# Patient Record
Sex: Female | Born: 1960 | Race: White | Hispanic: No | Smoking: Current some day smoker
Health system: Southern US, Academic
[De-identification: ages and names within clinical notes are randomized; demographics above are authoritative.]

## PROBLEM LIST (undated history)

## (undated) DIAGNOSIS — G35 Multiple sclerosis: Secondary | ICD-10-CM

## (undated) DIAGNOSIS — R569 Unspecified convulsions: Secondary | ICD-10-CM

## (undated) DIAGNOSIS — G43909 Migraine, unspecified, not intractable, without status migrainosus: Secondary | ICD-10-CM

## (undated) DIAGNOSIS — F419 Anxiety disorder, unspecified: Secondary | ICD-10-CM

## (undated) DIAGNOSIS — F32A Depression, unspecified: Secondary | ICD-10-CM

## (undated) HISTORY — DX: Multiple sclerosis: G35

## (undated) HISTORY — DX: Migraine, unspecified, not intractable, without status migrainosus: G43.909

## (undated) HISTORY — PX: HX APPENDECTOMY: SHX54

## (undated) HISTORY — DX: Depression, unspecified: F32.A

## (undated) HISTORY — DX: Anxiety disorder, unspecified: F41.9

## (undated) HISTORY — PX: HX TONSILLECTOMY: SHX27

## (undated) HISTORY — DX: Unspecified convulsions: R56.9

---

## 2004-12-22 ENCOUNTER — Emergency Department (HOSPITAL_COMMUNITY): Payer: Self-pay | Admitting: EXTERNAL

## 2020-08-22 ENCOUNTER — Encounter (FREE_STANDING_LABORATORY_FACILITY): Admit: 2020-08-22 | Discharge: 2020-08-22 | Disposition: A | Discharge status: Home / Self Care | Payer: Self-pay

## 2021-03-10 ENCOUNTER — Ambulatory Visit (INDEPENDENT_AMBULATORY_CARE_PROVIDER_SITE_OTHER): Payer: Self-pay | Admitting: Neurology

## 2021-03-27 ENCOUNTER — Encounter (INDEPENDENT_AMBULATORY_CARE_PROVIDER_SITE_OTHER): Payer: Self-pay | Admitting: Neurology

## 2021-03-27 ENCOUNTER — Ambulatory Visit (INDEPENDENT_AMBULATORY_CARE_PROVIDER_SITE_OTHER): Payer: Medicaid Other | Admitting: Rheumatology

## 2021-03-27 ENCOUNTER — Ambulatory Visit: Payer: Medicaid Other | Attending: Neurology | Admitting: Neurology

## 2021-03-27 ENCOUNTER — Other Ambulatory Visit: Payer: Self-pay

## 2021-03-27 VITALS — BP 136/74 | HR 94 | Ht 63.0 in | Wt 107.6 lb

## 2021-03-27 DIAGNOSIS — G43709 Chronic migraine without aura, not intractable, without status migrainosus: Secondary | ICD-10-CM | POA: Insufficient documentation

## 2021-03-27 DIAGNOSIS — G35 Multiple sclerosis: Secondary | ICD-10-CM

## 2021-03-27 DIAGNOSIS — Z681 Body mass index (BMI) 19 or less, adult: Secondary | ICD-10-CM

## 2021-03-27 DIAGNOSIS — Z79899 Other long term (current) drug therapy: Secondary | ICD-10-CM | POA: Insufficient documentation

## 2021-03-27 LAB — COMPREHENSIVE METABOLIC PANEL, NON-FASTING
ALBUMIN: 3.8 g/dL (ref 3.4–4.8)
ALKALINE PHOSPHATASE: 71 U/L (ref 50–130)
ALT (SGPT): 21 U/L (ref 8–22)
ANION GAP: 10 mmol/L (ref 4–13)
AST (SGOT): 29 U/L (ref 8–45)
BILIRUBIN TOTAL: 0.7 mg/dL (ref 0.3–1.3)
BUN/CREA RATIO: 8 (ref 6–22)
BUN: 8 mg/dL (ref 8–25)
CALCIUM: 9.2 mg/dL (ref 8.8–10.2)
CHLORIDE: 104 mmol/L (ref 96–111)
CO2 TOTAL: 27 mmol/L (ref 23–31)
CREATININE: 0.95 mg/dL (ref 0.60–1.05)
ESTIMATED GFR: 65 mL/min/BSA (ref >=60)
GLUCOSE: 89 mg/dL (ref 65–125)
POTASSIUM: 4.6 mmol/L (ref 3.5–5.1)
PROTEIN TOTAL: 7 g/dL (ref 6.0–8.0)
SODIUM: 141 mmol/L (ref 136–145)

## 2021-03-27 LAB — CBC WITH DIFF
BASOPHIL #: 0.1 10*3/uL (ref <=0.20)
BASOPHIL %: 1 %
EOSINOPHIL #: 0.15 10*3/uL (ref <=0.50)
EOSINOPHIL %: 2 %
HCT: 41.7 % (ref 34.8–46.0)
HGB: 13.6 g/dL (ref 11.5–16.0)
IMMATURE GRANULOCYTE #: <0.1 10*3/uL (ref <0.10)
IMMATURE GRANULOCYTE %: 0 % (ref 0–1)
LYMPHOCYTE #: 2.32 10*3/uL (ref 1.00–4.80)
LYMPHOCYTE %: 34 %
MCH: 30 pg (ref 26.0–32.0)
MCHC: 32.6 g/dL (ref 31.0–35.5)
MCV: 91.9 fL (ref 78.0–100.0)
MONOCYTE #: 0.49 10*3/uL (ref 0.20–1.10)
MONOCYTE %: 7 %
MPV: 9.9 fL (ref 8.7–12.5)
NEUTROPHIL #: 3.85 10*3/uL (ref 1.50–7.70)
NEUTROPHIL %: 56 %
PLATELETS: 326 10*3/uL (ref 150–400)
RBC: 4.54 10*6/uL (ref 3.85–5.22)
RDW-CV: 13 % (ref 11.5–15.5)
WBC: 6.9 10*3/uL (ref 3.7–11.0)

## 2021-03-27 LAB — HIV1/HIV2 SCREEN, COMBINED ANTIGEN AND ANTIBODY: HIV SCREEN, COMBINED ANTIGEN & ANTIBODY: NEGATIVE

## 2021-03-27 LAB — HEPATITIS C ANTIBODY SCREEN WITH REFLEX TO HCV PCR: HCV ANTIBODY QUALITATIVE: NEGATIVE

## 2021-03-27 LAB — HEPATITIS B SURFACE ANTIGEN: HBV SURFACE ANTIGEN QUALITATIVE: NEGATIVE

## 2021-03-27 LAB — HEPATITIS A (HAV) IGM ANTIBODY: HAV IGM: NEGATIVE

## 2021-03-27 LAB — HEPATITIS B CORE IGM, AB: HBV CORE IGM ANTIBODY QUALITATIVE: NEGATIVE

## 2021-03-27 MED ORDER — LAMOTRIGINE 25 MG TABLET
25.0000 mg | ORAL_TABLET | Freq: Every day | ORAL | 5 refills | Status: DC
Start: 2021-03-27 — End: 2021-09-25

## 2021-03-27 MED ORDER — TIZANIDINE 2 MG TABLET
2.0000 mg | ORAL_TABLET | Freq: Three times a day (TID) | ORAL | 5 refills | Status: DC
Start: 2021-03-27 — End: 2021-08-21

## 2021-03-27 NOTE — Progress Notes (Signed)
[facility] of [facility]  Multiple Sclerosis and Clinical Neuroimmunology Clinic    Date:  03/27/2021  Name: Kaitlin Adams  Age:  60 y.o.  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  Steroid history:  None    Neurologic history:  -2015: Numbness in distal lower extremities, progressed to ankles and now to knees. Got MRI and LP. Started on Gilenya.   -Diagnosed with seizures in summer 2020 - arms contract, knees pull to her chest, shakes, jaw clenches, confused afterwards.    Pertinent studies:  -Reviewed MRI report from [place] from 01/2021 was read as stable white matter disease compatible with multiple sclerosis compared to 08/2019. Images not available.  -Prior records from [facility] indicate that she had C/T spine lesions on prior MRI and had 10 OCBs in her CSF.    ------------------------    Kaitlin Adams is a 60 y.o. female who presents with a chief concern of:     Chief Complaint   Patient presents with   . Multiple Sclerosis      History is detailed above. In addition we discussed the following today:    -+ headaches: constant headaches, has sinus issues, + nausea, + photophobia, phonophobia.    MS Review of Systems:  Fatigue/sleep: + Fatigue. Sleeps OK at night with Ambien, but does toss and turn sometimes. Legs jerk intermittently in her sleep.  Cognition: Some forgetfulness.   Vision: Wears glasses. Sees Dr Lucrezia Europe, has had reduced peripheral vision. Sees spots in her vision at times.  Spasticity: + stiffness. On baclofen 10 mg TID. Not significantly helpful, takes two sometimes without increased benefit. Has taken flexeril previously. Gets cramps in fingers and toes.  Gait: Legs give out  intermittently. Has fallen on the stairs at her house. Takes a yoga class.  Bladder/bowel: +slow urination, + frequency, constipation. Takes Linzess.  Pain: + chronic pain.  Mood: "Not the best." On Abilify.     Past Medical History:   Diagnosis Date   . Depression    . Migraine    . Multiple sclerosis (CMS HCC)    . Seizure (CMS HCC)    "Spot" on her lung.    Past Surgical History:   Procedure Laterality Date   . HX APPENDECTOMY     . HX CESAREAN SECTION     . HX TONSILLECTOMY         Current Outpatient Medications   Medication Sig   . albuterol sulfate (PROVENTIL OR VENTOLIN OR PROAIR) 90 mcg/actuation Inhalation HFA Aerosol Inhaler Take 1-2 Puffs by inhalation Every 6 hours as needed   . ARIPiprazole (ABILIFY) 2 mg Oral Tablet Take 2 mg by mouth Once a day   . azelastine (ASTELIN) 137 mcg (0.1 %) Nasal Aerosol, Spray USE 1 TO 2 SPRAY(S) IN EACH NOSTRIL ONCE DAILY   . baclofen (LIORESAL) 10 mg Oral Tablet Take 10 mg by mouth Three times a day   . cetirizine (ZYRTEC) 10 mg Oral Tablet Take 1 Tablet by mouth Once a day   . cholecalciferol, Vitamin D3, 75 mcg (3,000 unit) Oral Tablet Take 3,000 Units by mouth Once a day   . divalproex (DEPAKOTE) 250  mg Oral Tablet, Delayed Release (E.C.) TAKE 1 TABLET BY MOUTH EVERY 12 HOURS DO NOT CRUSH OR CHEW (Patient not taking: Reported on 03/27/2021)   . ELDERBERRY FRUIT ORAL Take by mouth   . Fingolimod 0.5 mg Oral Capsule Take 0.5 mg by mouth (Patient not taking: Reported on 03/27/2021)   . hydrOXYzine pamoate (VISTARIL) 50 mg Oral Capsule TAKE 1 CAPSULE BY MOUTH THREE TIMES DAILY AS NEEDED   . linaCLOtide (LINZESS) 145 mcg Oral Capsule Take 1 Capsule by mouth   . LORazepam (ATIVAN) 1 mg Oral Tablet Take 1 mg by mouth   . mirtazapine (REMERON) 30 mg Oral Tablet Take 30 mg by mouth Every night   . PARoxetine (PAXIL) 20 mg Oral Tablet Take 20 mg by mouth Once a day   . zinc 50 mg Oral Tablet Take 30 mg by mouth Once a day   . zolpidem (AMBIEN) 10 mg Oral Tablet Take 10 mg by  mouth Every night as needed   Depakote was 250 mg BID.  Dr Alfonse Ras is her psychiatrist at [facility].    Allergies   Allergen Reactions   . Ceclor [Cefaclor]    . Penicillins    . Sulfa (Sulfonamides)      Family Medical History:    None     Multiple sclerosis: Four cousins, possibly her son.    Social History     Socioeconomic History   . Marital status: Married   Tobacco Use   . Smoking status: Never Smoker   . Smokeless tobacco: Never Used   Substance and Sexual Activity   . Alcohol use: Yes   . Drug use: Never   No smoking, minimal EtOH use.    Review of Systems:  Gen: + fatigue. ID: No current infections. HEENT: No hearing or vision changes. Chronic rhinorrhea. Respiratory: + shortness of breath. Cardiac: + chest pain. GU: No hematuria. Rare UTIs. GI: No GI bleeding. Extremities: No peripheral edema. Musculoskeletal: + pain. Neuro: As above. Psychiatric: As above. Skin: Has had "skin coming off of her eye" recently.    Examination:  Vitals: BP 136/74   Pulse 94   Ht 1.6 m (5\' 3" )   Wt 48.8 kg (107 lb 9.4 oz)   BMI 19.06 kg/m     General Exam:  General: No acute distress.  HEENT: No scleral icterus.  Extremities: No peripheral edema.  Psychiatric: Affect calm.    Neurologic Exam:  Ophthalmoscopic: Limited undilated exam but no obvious papilledema or pallor.  Orientation: Awake, alert, appropriately oriented.  Memory: Recent and remote memory appear intact. Formal memory testing not completed today.  Attention: Normal in conversation.  Knowledge: Appropriate.  Language: Fluent with intact comprehension.  Speech: Normal.  Cranial nerves: Pupils are equal and reactive bilaterally. Extraocular movements are intact. Face activates symmetrically. Hearing intact to conversation. Shoulder shrug 5/5 bilaterally. Unable to assess remainder of cranial nerves as patient wearing mask for infection prevention purposes.  Sensory: Intact to light touch in bilateral hands.  Muscle tone: Spastic catch in bilateral  LE.    Muscle exam:  Arm Right Left Leg Right Left   Deltoid 5/5 5/5 Iliopsoas 4/5 4/5   Biceps 5/5 5/5 Quads 5/5 5/5   Triceps 5/5 5/5 Hamstrings 5/5 5/5   Interossei 5/5 5/5 Ankle Dorsi Flexion 4/5 4/5     Reflexes:   RJ BJ TJ KJ AJ Plantars   Right 2+ 2+ 2+ 3+ 2+ Downgoing   Left 2+ 2+ 2+ 3+ 2+ Downgoing  Coordination: Finger-nose without dysmetria bilaterally.   Gait: Normal casual gait.    Assessment and Plan:  1. Multiple sclerosis (CMS HCC)    2. Chronic migraine w/o aura w/o status migrainosus, not intractable    3. 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 Do not have prior imaging to review but reports from [facility] appear consistent. Will request prior images and recommend updating MRI of her C/T spine. Discussed DMT options in detail focusing on resuming Gilenya, vs transition to Mayzent or Aubagio. She opted to transition to Mayzent which I think is a good choice. Discussed risks:benefits of Mayzent in detail including risk of PML. Start form filled out today and will get baseline labs. For symptomatic management, will transition baclofen to tizanidine which may also help with headaches. Discussed side effects including sedation. For history of seizure, discussed resuming Depakote vs trial of Lamictal - will try Lamictal. Discussed side effects in detail including risk of SJS. Initially planned on Zonisamide but she has sulfa allergy. Also considered gabapentin/Lyrica as may have concomitant benefit in pain/headaches but she has been on these previously and did not tolerate.     Orders Placed This Encounter   . MRI SPINE CERVICAL W/WO CONTRAST   . MRI SPINE THORACIC W/WO CONTRAST   . CBC/DIFF   . COMPREHENSIVE METABOLIC PANEL, NON-FASTING   . HEPATITIS A IGM ANTIBODY   . HEPATITIS B SURFACE ANTIGEN   . HEPATITIS B CORE IGM, AB   . HEPATITIS C ANTIBODY SCREEN WITH REFLEX TO HCV PCR   . VARICELLA-ZOSTER ANTIBODY, IGG, SERUM   .  QUANTIFERON TB GOLD PLUS, BLOOD   . HIV1/HIV2 SCREEN, COMBINED ANTIGEN AND ANTIBODY   . CANCELED: HELP LAB ORDER   . ECG 12-LEAD   . tiZANidine (ZANAFLEX) 2 mg Oral Tablet   . lamoTRIgine (LAMICTAL) 25 mg Oral Tablet       Return in about 3 months (around 06/26/2021).     On the day of the encounter, a total of 65 minutes was spent on this patient encounter including review of historical information, examination, documentation and post-visit activities.    Rosalyn Gess, MD 03/27/2021  Assistant Professor, Department of Neurology

## 2021-03-27 NOTE — Patient Instructions (Addendum)
Lamictal for seizures:  -Start 1 tablet daily x2 weeks.  -Then increase to 1 tablet twice daily x2 weeks.  -Then increase to 1 tablet in the morning and 1 tablet at night in 2 weeks.  -Then increase to 2 tablets twice daily.    1) We strongly encourage you to sign up for our patient portal, Mychart. Most results and messages will be communicated with you through Mychart when you are in between appointments.  2) Test results will be automatically released to your Mychart as soon as they are done. This means you may see your results before Dr Elesa Massed does. Dr Elesa Massed tries to send a message on Mychart with her comments about your results within one week. You will be contacted sooner if you have emergency findings on your tests. If you do not use Mychart, your results will be discussed with you at your next follow up visit. If you have additional questions about your results that cannot be addressed in Mychart, and cannot wait until your next appointment, please schedule an earlier follow up appointment or schedule a telephone visit to discuss.   3) Dr. Elesa Massed does not routinely respond to messages (Mychart or telephone) on Wednesdays unless it is an emergency.   4) Please allow up to 48 business hours for a reply to [employer]. If you are having a medical emergency you should NOT use Mychart to communicate and should be evaluated in your nearest ER. Mychart messages are not viewed on weekends or after hours.

## 2021-03-27 NOTE — Progress Notes (Signed)
Spoke with Mr. And Mrs. Winslett during appointment to discuss Mayzent.      Previous DMT: Gilenya     Discussed differences and similarities between Gilenya and Mayzent.  Mayzent has less impact on HR because of its increased specificity on the body as well as the gradual dose increase.  The lessened impact on heart rate allows the FDO to be skipped. Side effects (infection risk, blood pressure, etc) and monitoring requirements are mostly the same otherwise.  Mayzent dose will be determined by genetic typing lab results.     Advised the insurance process can take several weeks, will keep updated on insurance response/ if referring to NPAF.  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     Reviewed and updated medications with Mrs. Detlefsen.    - Of note, when took daily Vitamin Caused levels too increase to unsafe range.  Now only takes 3,000 units MWF. Updated for future monitoring reference.   * Tizanidine added during appt today - will contact Mrs. 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     Also reviewed allergies - sulfa allergy was to an antibiotic prescribed for ears when younger. (Bactrim).  Unsure what the reaction was.      Provided with contact information for any questions or concerns that arise.  Maylon Cos, [place]  03/27/2021, 15:55

## 2021-03-28 LAB — VARICELLA-ZOSTER ANTIBODY, IGG, SERUM: VZV IGG QUALITATIVE: POSITIVE

## 2021-03-31 LAB — QUANTIFERON TB GOLD PLUS, BLOOD
MITOGEN MINUS NIL RESULT: >10 IU/mL
NIL RESULT: 0.01 IU/mL
QUANTIFERON, QUALITATIVE: NEGATIVE
TB1 AG MINUS NIL RESULT: 0.01 IU/mL
TB2 AG MINUS NIL RESULT: 0 IU/mL

## 2021-04-03 ENCOUNTER — Encounter (INDEPENDENT_AMBULATORY_CARE_PROVIDER_SITE_OTHER): Payer: Self-pay

## 2021-04-09 LAB — CYTOCHROME P450 2C9,NOVARTIS

## 2021-04-10 ENCOUNTER — Encounter (INDEPENDENT_AMBULATORY_CARE_PROVIDER_SITE_OTHER): Payer: Self-pay

## 2021-04-10 ENCOUNTER — Telehealth (INDEPENDENT_AMBULATORY_CARE_PROVIDER_SITE_OTHER): Payer: Self-pay

## 2021-04-10 MED ORDER — MAYZENT 2 MG TABLET
2.0000 mg | ORAL_TABLET | Freq: Every day | ORAL | 11 refills | Status: DC
Start: 2021-04-10 — End: 2022-03-31
  Filled 2021-04-11 – 2021-10-03 (×3): qty 30, 30d supply, fill #0
  Filled 2021-11-03: qty 30, 30d supply, fill #1
  Filled 2021-12-02: qty 30, 30d supply, fill #2
  Filled 2022-01-01: qty 30, 30d supply, fill #3
  Filled 2022-01-29: qty 30, 30d supply, fill #4
  Filled 2022-03-02 – 2022-03-18 (×2): qty 30, 30d supply, fill #5

## 2021-04-10 MED ORDER — MAYZENT STARTER PACK (FOR 2 MG MAINT DOSE) 0.25 MG (12 TABS) TABLETS
ORAL_TABLET | ORAL | 0 refills | Status: DC
Start: 2021-04-10 — End: 2022-01-06
  Filled 2021-04-11 – 2021-09-29 (×2): qty 12, 5d supply, fill #0

## 2021-04-10 NOTE — Telephone Encounter (Signed)
Prescription for Kaitlin sent to [facility] now that CYP testing results have come back. Start form uploaded to media.    While reviewing lab results noticed EKG was not complete.  Will need to complete before starting therapy.   ----  Spoke with [name] to update on EKG. Kaitlin Adams stated the new medicine (lamotrigine or tizanidine) has knocked her on her butt."  She started both the lamotrigine and tizanidine yesterday. Verified she did discontinue Depakote.  Insurance wouldn't let tizanidine be filled until baclofen was used up.  She is unsure which medication is causing to be so tired.  Discussed that tizanidine can cause drowsiness and it is okay to skip a dose if making too tired.  She stated she did yoga yesterday and thinks it may be contributing to how tired she was.   Inquired how to take the lamotrigine as the instructions say to take as instructed. Advised the instructions should have been on the printout from her appointment, but will send a MyChart message with instructions to be safe.     Discussed EKG needs to be complete before can start Kaitlin.  Advised CYP results came back finally today, starting process for authorization now. If approved before EKG complete, will need to hold until able to do so.  Kaitlin Adams stated she will complete the EKG on the same day as her MRI is scheduled (5/23).    Encouraged to contact office with any questions, concerns, or if she develops side effects to any of the new medications. She verbalized agreement and understanding to all information.   Maylon Cos, [place]  04/10/2021, 16:02    MyChart message with calender outlining titration sent to patient.  Maylon Cos, [place]  04/10/2021, 16:19

## 2021-04-11 ENCOUNTER — Other Ambulatory Visit: Payer: Self-pay

## 2021-04-11 ENCOUNTER — Other Ambulatory Visit (INDEPENDENT_AMBULATORY_CARE_PROVIDER_SITE_OTHER): Payer: Self-pay

## 2021-04-11 NOTE — Telephone Encounter (Signed)
Prior authorization for Mayzent submitted electronically on 04/11/2021 through CoverMyMeds. Key [id number]. Waiting for response from payor.    Sissy Hoff, Pharmacy Technician  04/11/2021, 14:29

## 2021-04-14 ENCOUNTER — Other Ambulatory Visit: Payer: Self-pay

## 2021-04-15 ENCOUNTER — Other Ambulatory Visit: Payer: Self-pay

## 2021-04-15 NOTE — Telephone Encounter (Signed)
Specialty Pharmacy Note    Prior Authorization for medication Mayzent has been approved by payor RDT until 04/09/22.  Approval notice has been scanned into Epic and can be found in the Media tab. Faxed Hub form.    [facility] can fill.    If you have any questions, don't hesitate to contact the Specialty Pharmacy Sissy Hoff, Pharmacy Technician  04/15/2021, 16:10

## 2021-04-16 ENCOUNTER — Other Ambulatory Visit: Payer: Self-pay

## 2021-04-17 ENCOUNTER — Encounter (INDEPENDENT_AMBULATORY_CARE_PROVIDER_SITE_OTHER): Payer: Self-pay

## 2021-04-17 ENCOUNTER — Other Ambulatory Visit: Payer: Self-pay

## 2021-04-18 ENCOUNTER — Other Ambulatory Visit: Payer: Self-pay

## 2021-04-21 ENCOUNTER — Ambulatory Visit (HOSPITAL_COMMUNITY): Payer: Medicaid Other

## 2021-04-22 ENCOUNTER — Other Ambulatory Visit: Payer: Self-pay

## 2021-04-24 ENCOUNTER — Other Ambulatory Visit: Payer: Self-pay

## 2021-04-25 ENCOUNTER — Other Ambulatory Visit: Payer: Self-pay

## 2021-04-29 ENCOUNTER — Other Ambulatory Visit: Payer: Self-pay

## 2021-04-30 ENCOUNTER — Other Ambulatory Visit: Payer: Self-pay

## 2021-05-01 ENCOUNTER — Other Ambulatory Visit: Payer: Self-pay

## 2021-05-02 ENCOUNTER — Other Ambulatory Visit: Payer: Self-pay

## 2021-05-02 ENCOUNTER — Ambulatory Visit (INDEPENDENT_AMBULATORY_CARE_PROVIDER_SITE_OTHER): Payer: Self-pay

## 2021-05-02 NOTE — Telephone Encounter (Signed)
-----   Message from Lamont Dowdy, RN sent at 05/02/2021  3:08 PM EDT -----  Regarding: FW: Hayleigh/Ward  ----- Message from Georgina Peer sent at 05/02/2021  3:07 PM EDT -----  Vilinda Boehringer, incomplete enrollment form, missing one page.  Complete 2 page enrollment form with all filled out.  Thanks

## 2021-05-02 NOTE — Telephone Encounter (Signed)
The start form has been sent twice. It is not needed since Kaitlin Adams has [facility] medicaid, making her ineligible for copay assistance. NPAF is not needed since insurance approved [place] . Attempted to return call to Kaitlin Adams.  Left VM advising the start form will not be resent, as they will forward the Rx on our behalf (despite being asked not to do numerous times).  Kaitlin Adams is not cleared to start therapy yet, therefore, they don't need the 2nd page (AKA the Rx). Will also send e-mail to Kaitlin Adams requesting they stop calling us to ask for this.   Maylon Cos, PHARMD  05/02/2021, 17:17

## 2021-05-05 ENCOUNTER — Other Ambulatory Visit: Payer: Self-pay

## 2021-05-06 ENCOUNTER — Other Ambulatory Visit: Payer: Self-pay

## 2021-05-12 ENCOUNTER — Other Ambulatory Visit: Payer: Self-pay

## 2021-05-13 ENCOUNTER — Other Ambulatory Visit: Payer: Self-pay

## 2021-05-13 IMAGING — MG 3D SCREENING MAMMO BIL W/CAD & TOMO
5 series · 7 of 24 positions shown · non-contrast
Comparison: Outside mammograms dated 03/08/2019, 06/13/2020.

------------- REPORT [id number] -------------
﻿

SEPTY, SIU HO
EXAM:  BILATERAL DIGITAL SCREENING MAMMOGRAM WITH 3D TOMOSYNTHESIS AND CAD
INDICATION: Asymptomatic 60-year-old with no family history of breast cancer in first-degree relatives.  Lifetime breast cancer risk 7.9%.

[L]
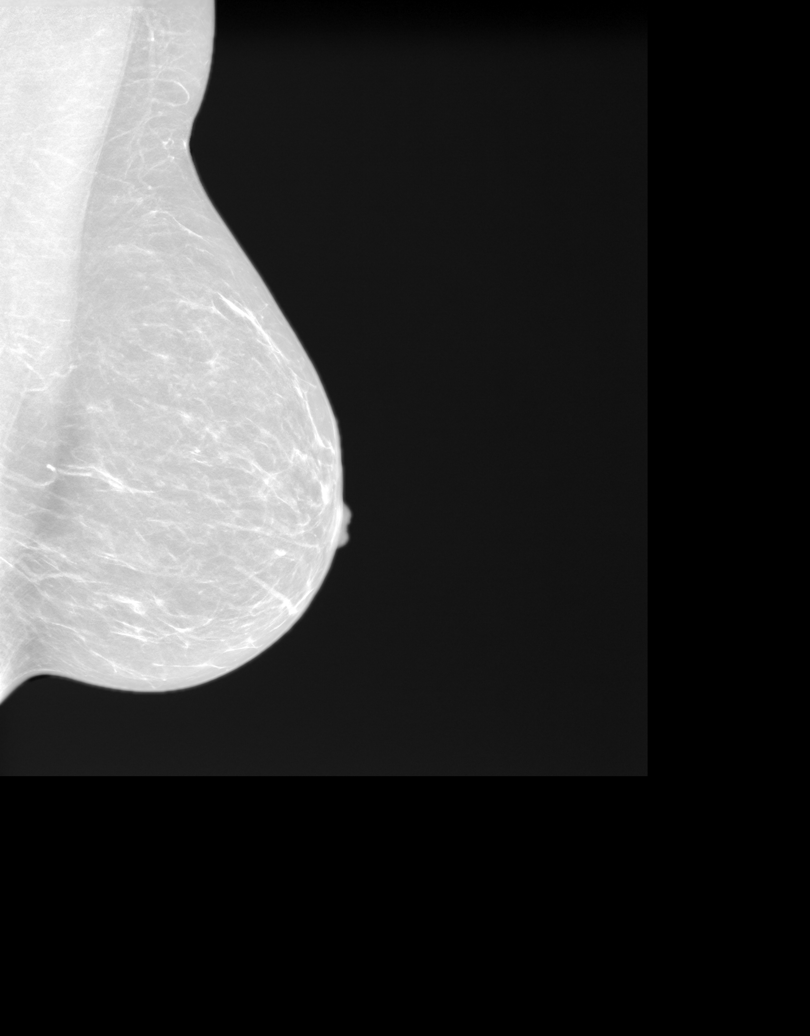

[R]
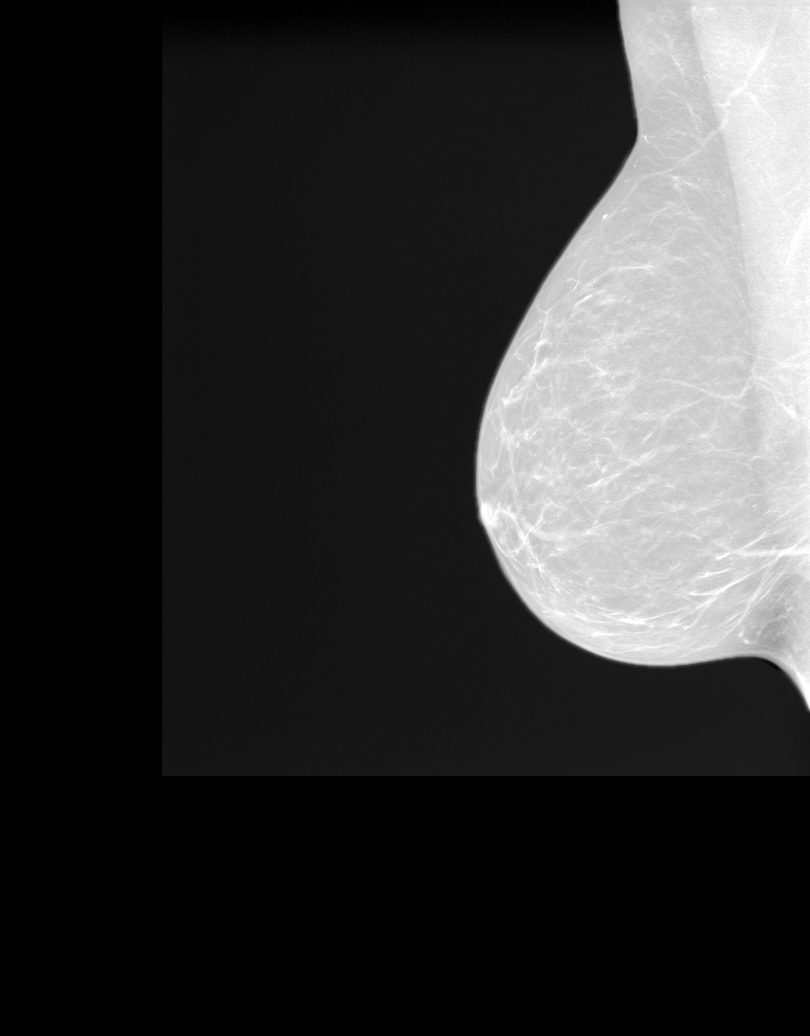

[R CC tomo · right · 0.10mm/px · 2 of 2 slices shown]
[im 1/2]
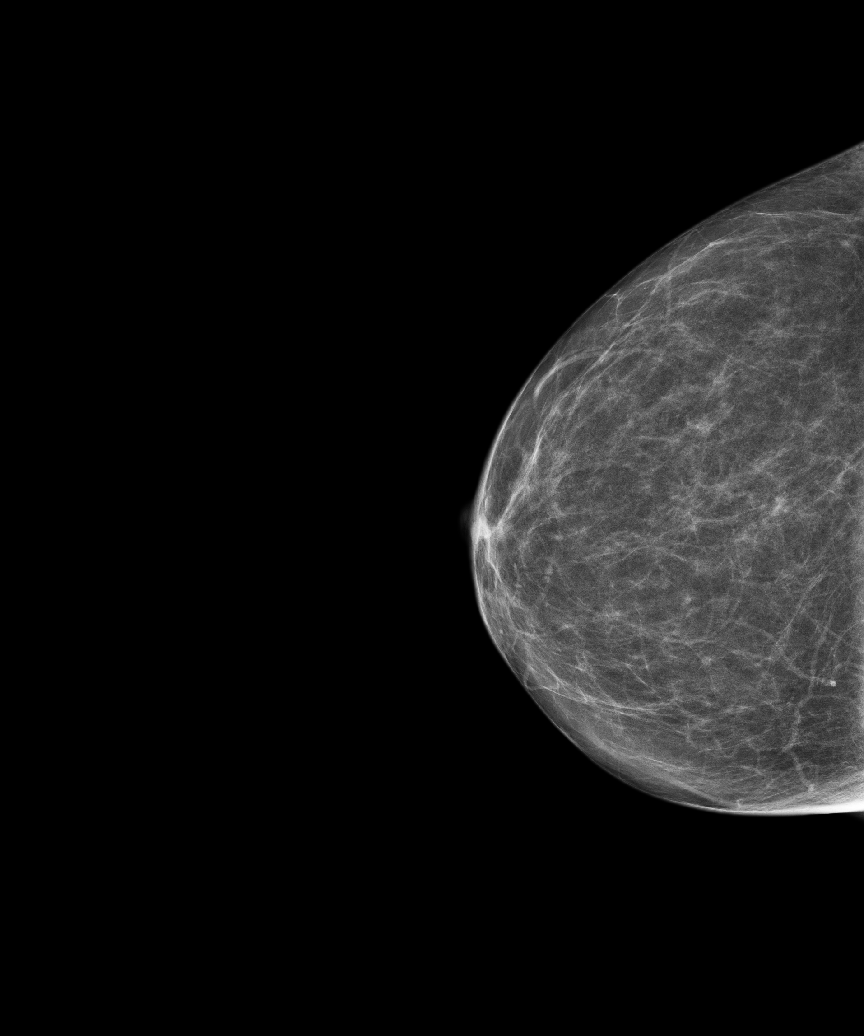
[im 2/2]
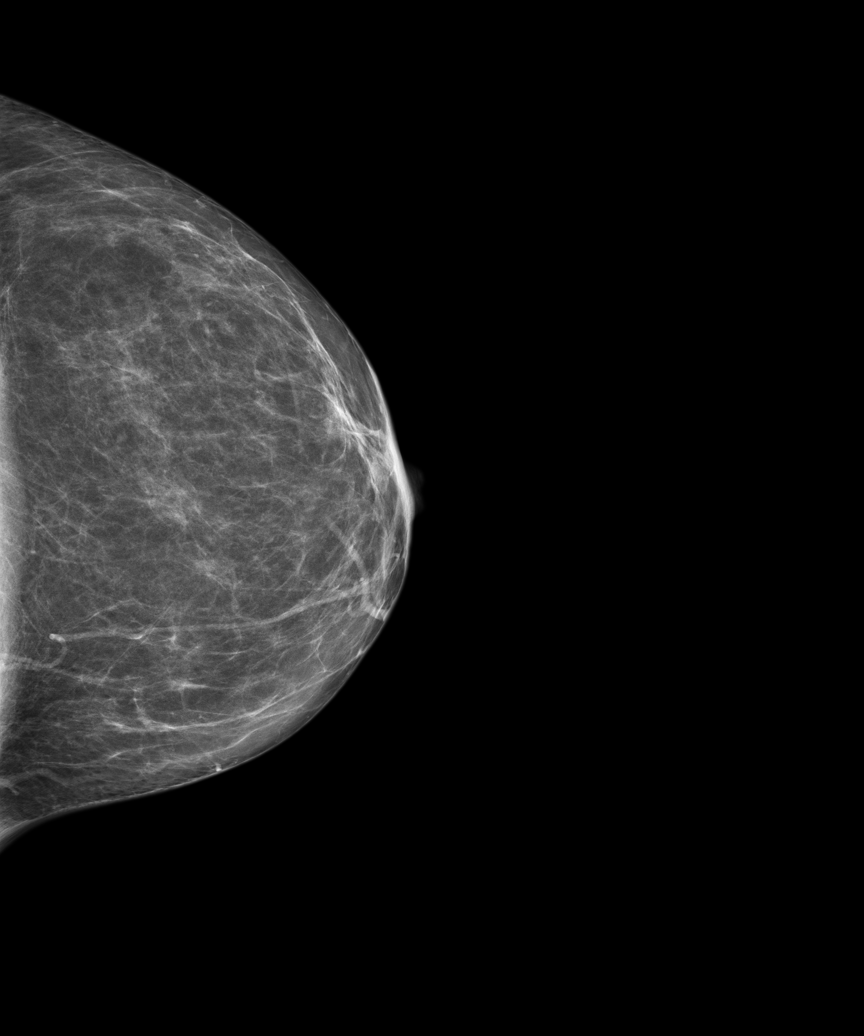

[3D SCREENING MAMMO BIL W/CAD & TOMO tomo · 2 acquisitions, 2 frames shown (1 of 2)]
[im 1/2]
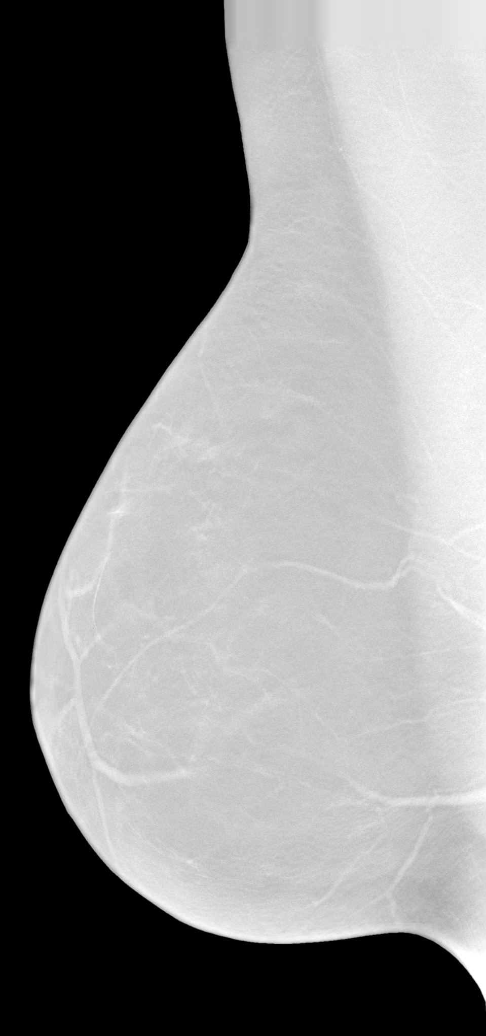
[im 2/2]
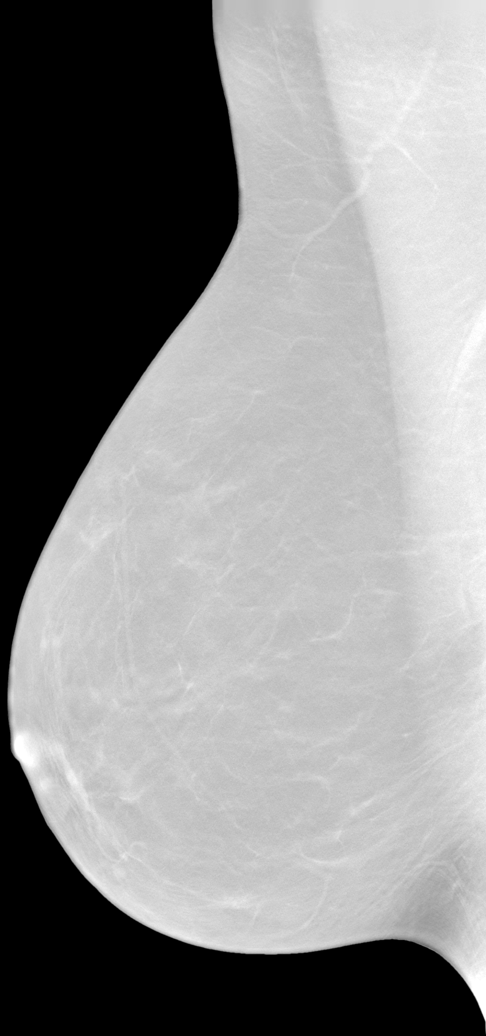

[3D SCREENING MAMMO BIL W/CAD & TOMO tomo (2 of 2) · tomo slice 7/41.0]
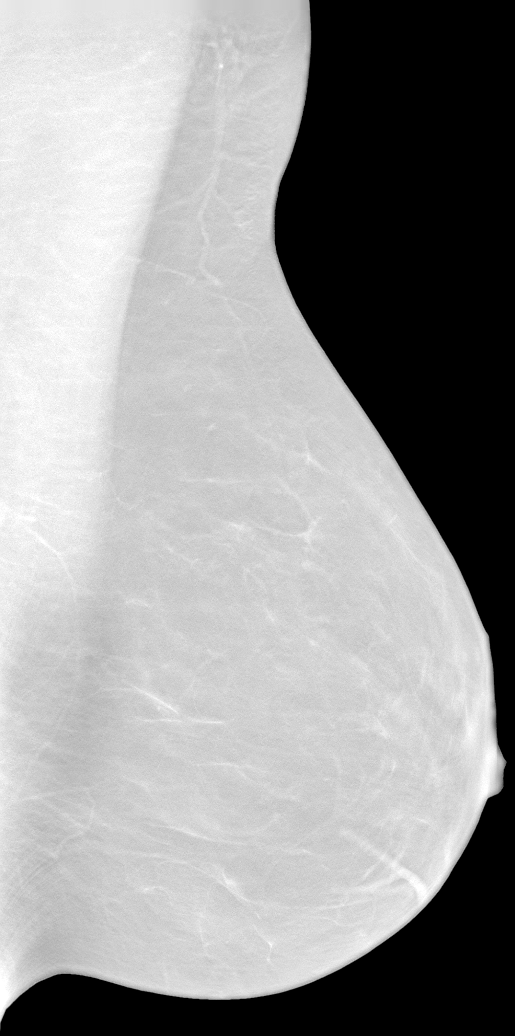

[7 of 24 positions shown; findings below may reference images not displayed]

FINDINGS: No focal mass or architectural changes are noted.  No abnormal calcific densities, skin changes, nipple changes or duct dilation are seen.
IMPRESSION: 1.  Stable mammographic findings.  Clinical and mammographic followup at 12 months.

2.  BIRADS 2-Benign findings. Patient has been added in a reminder system with a target date for the next screening mammography.

3.  DENSITY CODE – B (Scattered areas of fibroglandular density). 

Final Assessment Code:

Bi-Rads 2 

BI-RADS 0
 Need additional imaging evaluation.

BI-RADS 1
 Negative mammogram.

BI-RADS 2
 Benign finding.

BI-RADS 3
 Probably benign finding; short-interval follow-up suggested.

BI-RADS 4
 Suspicious abnormality; biopsy should be considered.

BI-RADS 5
 Highly suggestive of malignancy; appropriate action should be taken.

BI-RADS 6
 Known biopsy-proven malignancy; appropriate action should be taken.

NOTE:
In compliance with Federal regulations, the results of this mammogram are being sent to the patient.

------------- REPORT [id number] -------------
[facility]
[address]
T[place] Ms.DIRK, KAI-U[place]:
We wish to report the following on your recent mammography examination. We are sending a report to your referring physician or other health care provider. 
(       Normal/Negative:
No evidence of cancer.
This statement is mandated by the [place], Department of Health.
Your examination was performed by one of our technologists, who are registered radiological technologists and also specially certified in mammography:
___
Markland, Marjuan (M)

Your mammogram was interpreted by our radiologist.

( 
Collette Sedman, M.D.

(Annual Breast Examination by a physician or other health care provider
(Annual Mammography Screening beginning at age 40
(Monthly Breast Self Examination

## 2021-05-15 ENCOUNTER — Other Ambulatory Visit: Payer: Self-pay

## 2021-05-19 ENCOUNTER — Ambulatory Visit (INDEPENDENT_AMBULATORY_CARE_PROVIDER_SITE_OTHER): Payer: Self-pay | Admitting: Neurology

## 2021-05-19 NOTE — Nursing Note (Signed)
Attempted to call- no pick-up or VM. Need ECG completed before starting Mayzent. Order mailed to home address to do locally. Call if any questions    Clovis Pu, RN  05/19/2021, 14:47

## 2021-05-20 ENCOUNTER — Other Ambulatory Visit: Payer: Self-pay

## 2021-05-26 ENCOUNTER — Other Ambulatory Visit: Payer: Self-pay

## 2021-05-28 ENCOUNTER — Other Ambulatory Visit: Payer: Self-pay

## 2021-05-29 ENCOUNTER — Encounter (INDEPENDENT_AMBULATORY_CARE_PROVIDER_SITE_OTHER): Payer: Self-pay | Admitting: Neurology

## 2021-05-29 ENCOUNTER — Other Ambulatory Visit: Payer: Self-pay

## 2021-05-29 ENCOUNTER — Ambulatory Visit (INDEPENDENT_AMBULATORY_CARE_PROVIDER_SITE_OTHER): Payer: Self-pay | Admitting: Neurology

## 2021-05-29 NOTE — Telephone Encounter (Addendum)
Called phone number listed in message, no answer, unable to leave voicemail, sent a MyChart message.    ----- Message from Clovis Pu, RN sent at 05/29/2021  8:26 AM EDT -----  Regarding: FW: Dalana Pfahler  ----- Message from Jerrel Ivory sent at 05/28/2021  2:48 PM EDT -----  Yafet Cline - husband is calling and states that the medications you put pt on lamoTRIgine (LAMICTAL) 25 mg Oral Tablet and the tiZANidine (ZANAFLEX) 2 mg Oral Tablet   is making her very confused and dizzy and nauseous.  She has discontinued them and he needs to speak to you about getting something different.    Please call him.    Thanks,  Lupita Leash

## 2021-06-04 ENCOUNTER — Other Ambulatory Visit: Payer: Self-pay

## 2021-08-06 ENCOUNTER — Telehealth (INDEPENDENT_AMBULATORY_CARE_PROVIDER_SITE_OTHER): Payer: Self-pay

## 2021-08-06 NOTE — Telephone Encounter (Signed)
Kaitlin Adams contacted office with concerns regarding seizure activity. She reports discontinuing lamotrigine and tizanidine on own due to significant GI upset and drowsiness. Stopped taking both medications in late June, seizure episodes started shortly after.  Last episode Thursday (07/31/21) with loss of consciousness. She reports being aware of seizure while occurring. Denies any infections or sickness outside of seasonal allergies. Verified she does not live alone, however Kaitlin Adams stated her husband is unable to care for her due to his own medical conditions. She has not sought medical attention for episodes to date due to concern for COVID in ER.     Kaitlin Adams requests a new medication, is very fearful of additional seizures.  Also experiencing pain in left leg.     Strongly encouraged Kaitlin Adams to seek emergency medical attention if another seizure is experienced.  Message sent to Dr. Elesa Massed with above concerns. Will follow up with patient.    Of note, CrCl 47.9 mL/min based on creatine from April. Kaitlin Adams also has not started Mayzent for MS.   [place], PHARMD  08/06/2021, 15:08

## 2021-08-11 ENCOUNTER — Telehealth (INDEPENDENT_AMBULATORY_CARE_PROVIDER_SITE_OTHER): Payer: Self-pay | Admitting: Neurology

## 2021-08-11 MED ORDER — LEVETIRACETAM 500 MG TABLET
500.0000 mg | ORAL_TABLET | Freq: Two times a day (BID) | ORAL | 5 refills | Status: DC
Start: 2021-08-11 — End: 2021-09-25

## 2021-08-11 NOTE — Telephone Encounter (Signed)
Called about seizures. Not sure how many she has had but they have been frequent. Sometimes she loses consciousness but sometimes her husband can talk her through them. Not on Lamictal or Depakote - had dizziness and stopped. Discussed options, including trying Zonisamide and monitoring for sulfa allergy interaction, or Keppra. She opted for Keppra. Discussed side effects including that we need to monitor for mood effects. She voiced understanding. Will start 500 mg BID and increase to 1000 mg BID in one week. Asked her to call back if not improving before her follow up with me.

## 2021-08-21 ENCOUNTER — Telehealth: Payer: Medicaid Other | Admitting: Neurology

## 2021-08-21 ENCOUNTER — Encounter (INDEPENDENT_AMBULATORY_CARE_PROVIDER_SITE_OTHER): Payer: Self-pay

## 2021-08-21 DIAGNOSIS — R6889 Other general symptoms and signs: Secondary | ICD-10-CM

## 2021-08-21 DIAGNOSIS — G35 Multiple sclerosis: Secondary | ICD-10-CM

## 2021-08-21 MED ORDER — AMITRIPTYLINE 10 MG TABLET
10.0000 mg | ORAL_TABLET | Freq: Every evening | ORAL | 5 refills | Status: DC
Start: 2021-08-21 — End: 2024-06-12
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
## 2021-08-22 ENCOUNTER — Ambulatory Visit (INDEPENDENT_AMBULATORY_CARE_PROVIDER_SITE_OTHER): Payer: Self-pay | Admitting: Neurology

## 2021-08-22 NOTE — Telephone Encounter (Signed)
Regarding: Ward  ----- Message from Molli Barrows Dains sent at 08/21/2021  3:04 PM EDT -----  Fayrene Fearing from [facility] is calling the patient has a drug interaction flag coming up on her scripts    amitriptyline (ELAVIL) 10 mg Oral Tablet pt is also on Laproxatine and there is a drug interaction between these 2 medications can someone please call him @ [phone]    Preferred Pharmacy     [facility] [address], [place] - [address]    [address] [place] [address]    Phone: [phone] Fax: [phone]    Hours: Not open 24 hours

## 2021-08-22 NOTE — Nursing Note (Signed)
Called and verified RX    Clovis Pu, RN  08/22/2021, 12:02

## 2021-09-24 ENCOUNTER — Encounter (HOSPITAL_COMMUNITY): Payer: Self-pay | Admitting: Neurology

## 2021-09-24 ENCOUNTER — Inpatient Hospital Stay
Admission: RE | Admit: 2021-09-24 | Discharge: 2021-09-28 | DRG: 101 | Disposition: A | Discharge status: Home / Self Care | Payer: Medicaid Other | Source: Ambulatory Visit | Attending: Neurology | Admitting: Neurology

## 2021-09-24 ENCOUNTER — Observation Stay (HOSPITAL_COMMUNITY): Payer: Medicaid Other | Admitting: Neurology

## 2021-09-24 ENCOUNTER — Other Ambulatory Visit: Payer: Self-pay

## 2021-09-24 DIAGNOSIS — F32A Depression, unspecified: Secondary | ICD-10-CM | POA: Diagnosis present

## 2021-09-24 DIAGNOSIS — F419 Anxiety disorder, unspecified: Secondary | ICD-10-CM | POA: Diagnosis present

## 2021-09-24 DIAGNOSIS — Z79899 Other long term (current) drug therapy: Secondary | ICD-10-CM

## 2021-09-24 DIAGNOSIS — Z820 Family history of epilepsy and other diseases of the nervous system: Secondary | ICD-10-CM

## 2021-09-24 DIAGNOSIS — G43909 Migraine, unspecified, not intractable, without status migrainosus: Secondary | ICD-10-CM | POA: Diagnosis present

## 2021-09-24 DIAGNOSIS — Z882 Allergy status to sulfonamides status: Secondary | ICD-10-CM

## 2021-09-24 DIAGNOSIS — G35 Multiple sclerosis: Secondary | ICD-10-CM | POA: Diagnosis present

## 2021-09-24 DIAGNOSIS — G40109 Localization-related (focal) (partial) symptomatic epilepsy and epileptic syndromes with simple partial seizures, not intractable, without status epilepticus: Principal | ICD-10-CM | POA: Diagnosis present

## 2021-09-24 DIAGNOSIS — R569 Unspecified convulsions: Secondary | ICD-10-CM | POA: Diagnosis present

## 2021-09-24 DIAGNOSIS — Z880 Allergy status to penicillin: Secondary | ICD-10-CM

## 2021-09-24 DIAGNOSIS — Z91040 Latex allergy status: Secondary | ICD-10-CM

## 2021-09-24 DIAGNOSIS — R200 Anesthesia of skin: Secondary | ICD-10-CM

## 2021-09-24 DIAGNOSIS — Z881 Allergy status to other antibiotic agents status: Secondary | ICD-10-CM

## 2021-09-24 MED ORDER — AMITRIPTYLINE 10 MG TABLET
10.0000 mg | ORAL_TABLET | Freq: Every evening | ORAL | Status: DC
Start: 2021-09-24 — End: 2021-09-28
  Administered 2021-09-24 – 2021-09-27 (×4): 10 mg via ORAL
  Filled 2021-09-24 (×5): qty 1

## 2021-09-24 MED ORDER — ENOXAPARIN 40 MG/0.4 ML SUBCUTANEOUS SYRINGE
40.0000 mg | INJECTION | Freq: Every day | SUBCUTANEOUS | Status: DC
Start: 2021-09-25 — End: 2021-09-24

## 2021-09-24 MED ORDER — DIPHENHYDRAMINE 25 MG CAPSULE
25.0000 mg | ORAL_CAPSULE | ORAL | Status: DC | PRN
Start: 2021-09-24 — End: 2021-09-28

## 2021-09-24 MED ORDER — LORAZEPAM 2 MG/ML INJECTION WRAPPER
2.0000 mg | Freq: Once | INTRAMUSCULAR | Status: DC | PRN
Start: 2021-09-24 — End: 2021-09-28

## 2021-09-24 MED ORDER — LEVETIRACETAM 500 MG TABLET
500.0000 mg | ORAL_TABLET | Freq: Once | ORAL | Status: AC
Start: 2021-09-24 — End: 2021-09-24
  Administered 2021-09-24: 21:00:00 500 mg via ORAL
  Filled 2021-09-24: qty 1

## 2021-09-24 MED ORDER — MELATONIN 3 MG TABLET
3.0000 mg | ORAL_TABLET | Freq: Every evening | ORAL | Status: DC | PRN
Start: 2021-09-24 — End: 2021-09-28
  Administered 2021-09-24 – 2021-09-26 (×3): 3 mg via ORAL
  Filled 2021-09-24 (×3): qty 1

## 2021-09-24 MED ORDER — ARIPIPRAZOLE 2 MG TABLET
2.0000 mg | ORAL_TABLET | Freq: Every day | ORAL | Status: DC
Start: 2021-09-25 — End: 2021-09-28
  Administered 2021-09-25 – 2021-09-28 (×4): 2 mg via ORAL
  Filled 2021-09-24 (×4): qty 1

## 2021-09-24 MED ORDER — SIPONIMOD 2 MG TABLET
2.0000 mg | ORAL_TABLET | Freq: Every day | ORAL | Status: DC
Start: 2021-09-25 — End: 2021-09-24

## 2021-09-24 MED ORDER — LAMOTRIGINE 25 MG TABLET
25.0000 mg | ORAL_TABLET | Freq: Once | ORAL | Status: AC
Start: 2021-09-24 — End: 2021-09-24
  Administered 2021-09-24: 21:00:00 25 mg via ORAL
  Filled 2021-09-24: qty 1

## 2021-09-24 MED ORDER — LORAZEPAM 2 MG/ML INJECTION WRAPPER
4.0000 mg | INTRAMUSCULAR | Status: DC | PRN
Start: 2021-09-24 — End: 2021-09-28

## 2021-09-24 MED ORDER — ENOXAPARIN 40 MG/0.4 ML SUBCUTANEOUS SYRINGE
40.0000 mg | INJECTION | Freq: Every day | SUBCUTANEOUS | Status: DC
Start: 2021-09-25 — End: 2021-09-28
  Administered 2021-09-25 – 2021-09-28 (×4): 40 mg via SUBCUTANEOUS
  Filled 2021-09-24 (×4): qty 0.4

## 2021-09-24 MED ORDER — MIRTAZAPINE 15 MG TABLET
30.0000 mg | ORAL_TABLET | Freq: Every evening | ORAL | Status: DC
Start: 2021-09-24 — End: 2021-09-28
  Administered 2021-09-24 – 2021-09-27 (×4): 30 mg via ORAL
  Filled 2021-09-24 (×4): qty 2

## 2021-09-24 MED ORDER — ACETAMINOPHEN 325 MG TABLET
650.0000 mg | ORAL_TABLET | ORAL | Status: DC | PRN
Start: 2021-09-24 — End: 2021-09-28
  Administered 2021-09-24 – 2021-09-28 (×5): 650 mg via ORAL
  Filled 2021-09-24 (×5): qty 2

## 2021-09-24 MED ORDER — PAROXETINE 20 MG TABLET
20.0000 mg | ORAL_TABLET | Freq: Every day | ORAL | Status: DC
Start: 2021-09-25 — End: 2021-09-28
  Administered 2021-09-25 – 2021-09-28 (×4): 20 mg via ORAL
  Filled 2021-09-24 (×4): qty 1

## 2021-09-24 NOTE — Nurses Notes (Signed)
Pt arrived to room 1069 as elective EMU admission.

## 2021-09-24 NOTE — H&P (Incomplete)
[facility]   Neurology H&P     Kaitlin Adams 60 y.o.  Date of Admission:  09/24/2021  Date of Birth: 1960/12/21    PCP: Aleene Davidson, PA-C    Information obtained from: patient and history reviewed via medical record  Chief Complaint:  Spell Capture     HPI:  Kaitlin Adams is a 60 y.o. female who presents for elective EMU admission to evaluate for spell capture. The patient's last spell was *** in which she was found to ***. She continues to be adherent on her Keppra 1000 mg BID***. Per chart review the patient was diagnosed with seizures in 2020. Her semiology has not changed***.     Spell Type of Concern:  Type 1:   1. Aura:   2. Description:   3. Post- ictal symptoms:   4. Duration:   5. Frequency:   6. Diurnal variation:   7: Aggravating/relieving factors  8: History of status epilepticus    Other potential evidence of seizures:***  Staring spells with or with out chewing movements and abnormal arm movements - no  Spells of eye deviation/head deviation - no  Spells of language difficulties - no  Finding oneself in a place and not knowing how they got there - no  Unexplained loss of consciosuness- no  Waking up with a bitten tongue- no***  Unexplained bruises in sleep- no  Unexplained urinary incontinence in sleep in a continent patient - no  Myoclonic jerks, dropping objects - no    Current AED medications with doses:    Keppra 1000 mg BID    Previous AED   Depakote 250 mg BID - dizziness   Lamictal 25 mg - dizziness, nausea, disorientation     Prior Workup:    Past EEG studies : None available within [facility] system, nor patient records review     Past History- No history of head trauma followed by loss of consciousness, no history of meningitis, no history of birth trauma or developmental delay     Family history of epilepsy. Noncontributory     Per chart review: [facility]     Telemedicine Note Dated 08/21/21   -Had a seizure last week/week before. Husband laid across her and had to hold her mouth so  she wouldn't swallow her tongue. Briefer than prior spells. On Keppra 1000 mg BID.  -EMU admission for spell capture    Clinic Note dated 03/27/21   -2015: Numbness in distal lower extremities, progressed to ankles and now to knees. Got MRI and LP. Started on Gilenya.   -Diagnosed with seizures in summer 2020 - arms contract, knees pull to her chest, shakes, jaw clenches, confused afterwards    Per chart review: [facility]   Patient was admitted in [facility] on 06/21/2020 for "a tonic-clonic seizure and new onset patchy distribution numbness concerning for an MS exacerbation. Due to concern for an MS exacerbation, an MRI of the brain and C-spine with and without contrast was obtained and did not show any new or active lesions. There was an area of potential enhancement at T10, though this was favored to represent artifact. 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  Due to reported history of tonic-clonic seizures and other histories (migraines, anxiety), the patient was started on Depakote 250mg  BID in the emergency department. The patient has had a normal EEG in the past. However, a routine EEG was repeated and did not show any seizure activity. Given reported history of prior seizures, she will be  continued on Depakote at discharge".      Admission Source:  Home  Advance Directives:  None-Discussed  Hospice involvement prior to admission?  Not applicable    Past Medical History:   Diagnosis Date   . Depression    . Migraine    . Multiple sclerosis (CMS HCC)    . Seizure (CMS [facility])          Past Surgical History:   Procedure Laterality Date   . HX APPENDECTOMY     . HX CESAREAN SECTION     . HX TONSILLECTOMY            Cannot display prior to admission medications because the patient has not been admitted in this contact.        Allergies   Allergen Reactions   . Ceclor [Cefaclor]    . Penicillins    . Sulfa (Sulfonamides)      Social History     Tobacco Use   . Smoking status: Never   . Smokeless  tobacco: Never   Substance Use Topics   . Alcohol use: Yes     Past Family History:   Family Medical History:     Problem Relation (Age of Onset)    Multiple Sclerosis Other            ROS:   Constitutional: Negative for fevers or chills  Eyes: Negative for tearing, or itching of eyes.  Ears, nose, mouth, throat, and face: Negative for loss of hearing, sore throat, or hoarseness.   Respiratory: No shortness of breath, wheezing, or coughing.  Cardiovascular: No chest pain, palpitations, or peripheral edema.  Gastrointestinal: No nausea/vomiting or abdominal pain.  Integument/breast: No rashes, itching.  Hematologic/lymphatic: No history of easy bleeding or bruising.  Musculoskeletal: No weakness or muscle cramping.  Neurological: per HPI  Behavioral/Psych: No anxiety or depression.  Endocrine: No heat/cold intolerance, polyuria, or polydipsia.     Exam: ***     General: Appears in good health, no distress  HENT: Head atraumatic and normocephalic  Neck: No thyromegaly or lymphadenopathy  Carotids: Carotids normal without bruit  Lungs: Clear to auscultation bilaterally.   Cardiovascular: regular rate and rhythm  Abdomen: Soft, non-tender  Extremities: No cyanosis or edema  Ophthalomscopic: optic discs incompletely visualized owing to non dilated pupils  Mental status:  Level of Consciousness: alert  Orientations: Alert and oriented x 3  Memory  Registration, Recall, and Following of commands is normal  Attentions Attention and Concentration are normal  Knowledge: Good  Language: Normal  Speech: Normal  Cranial nerves:   CN2: Visual acuity and fields intact  CN 3,4,6: EOMI, PERRLA  CN 5 Facial sensation intact  CN 7 Face symmetrical  CN 8: Hearing grossly intact  CN 9,10: Palate symmetric and gag normal  CN 11: Sternocleidomastoid and Trapezius have normal strength.  CN 12: Tongue normal with no fasiculations or deviation  Gait, Coordination, and Reflexes:   Gait: Normal  Coordination: Coordination is normal without  tremor  Muscle tone: WNL  Muscle exam  Arm Right Left Leg Right Left   Deltoid 5/5 5/5 Iliopsoas 5/5 5/5   Biceps 5/5 5/5 Quads 5/5 5/5   Triceps 5/5 5/5 Hamstrings 5/5 5/5   Wrist Extension 5/5 5/5 Ankle Dorsi Flexion 5/5 5/5   Wrist Flexion 5/5 5/5 Ankle Plantar Flexion 5/5 5/5   Interossei 5/5 5/5 Ankle Eversion 5/5 5/5   APB 5/5 5/5 Ankle Inversion 5/5 5/5  Reflexes   RJ BJ TJ KJ AJ Plantars Hoffman's   Right 2+ 2+ 2+ 2+ 2+ Downgoing Not present   Left 2+ 2+ 2+ 2+ 2+ Downgoing Not present     Sensory: Sensory exam in the upper and lower extremities is normal  Normal color, texture and turgor without significant lesions or rashes  Diabetes Monitors:  No results for input(s): HA1C, GLUCOSEFAST, CHOLESTEROL, HDLCHOL, LDLCHOL, LDLCHOLDIR, TRIG in the last 13086 hours.    Labs:    I have reviewed all lab results.    Review of reports and notes reveal:   Independent Interpretation of images or specimens: None     Assessment/Plan:  -Admit to EMU for interictal monitoring   -Ordered extended vEEG  -Neuro checks and vitals q4h when awake  -Regular diet   -Home AED regimen: Keppra 1000 mg BID   -Continue all other home medications   -Anticipated length of stay: 3-5 days  - Plan for clinical seizure:  1. If patient has 4 or more focal seizures within 30 min to 1 hour in a cluster, give ativan 2 mg one time. If patient continues to cluster 4 focal seizures, can give one more dose ativan 2 mg  2. Give ativan 2 mg for any GTC seizure lasting 5 minutes or greater    Date Day of Stay AED plan   1 09/24/21 . Wean Keppra 500 mg tonight, off tomorrow            DNR Status this admission:  Full Code  Palliative/Supportive Care consulted?  no  Hospice Consulted?  Not applicable    Current Comorbid Conditions - Neurology H&P  Compression of the Brain:  No  Obstructive Hydrocephalus:  no  Coma (GCS less than 8): Coma -Not applicable  TIA not applicable  Encephalopathy:  no  Encephalitis-Not applicable  Seizure-Unspecified     Respiratory Failure/Other-Not applicable  No  Coagulopathy Not applicable  N/A    DVT/PE Prophylaxis: Enoxaparin          April Manson, MD   09/24/2021, 14:33  Neurology, PGY-2  [facility]  Pager: [phone]

## 2021-09-24 NOTE — H&P (Signed)
[facility]   Neurology H&P     Kaitlin Adams 60 y.o.  Date of Admission:  09/24/2021  Date of Birth: 05-30-1961    PCP: Aleene Davidson, PA-C    Information obtained from: patient and history reviewed via medical record  Chief Complaint: Elective EMU admission for event characterization    HPI:  Kaitlin Adams is a 60 y.o. female who presents for elective EMU admission to evaluate for event characterization.  Follows with Dr. Elesa Massed in [facility]rology clinic.    Clinic Note dated 03/27/21   -2015: Numbness in distal lower extremities, progressed to ankles and now to knees. Got MRI and LP. Started on Gilenya.   -Diagnosed with seizures in summer 2020 - arms contract, knees pull to her chest, shakes, jaw clenches, confused afterwards     Per chart review: [facility]   Patient was admitted in [facility] on 06/21/2020 for "a tonic-clonic seizure and new onset patchy distribution numbness concerning for an MS exacerbation. Due to concern for an MS exacerbation, an MRI of the brain and C-spine with and without contrast was obtained and did not show any new or active lesions. There was an area of potential enhancement at T10, though this was favored to represent artifact. 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   Due to reported history of tonic-clonic seizures and other histories (migraines, anxiety), the patient was started on Depakote 250mg  BID in the emergency department. The patient has had a normal EEG in the past. However, a routine EEG was repeated and did not show any seizure activity. Given reported history of prior seizures, she will be continued on Depakote at discharge".    Current AED medications with doses:    Keppra 1000 mg BID     Previous AED   Depakote 250 mg BID - dizziness   Lamictal 25 mg - dizziness, nausea, disorientation      Prior Workup:     Past EEG studies : None available within [facility] system, nor patient records review      Past History- No history of head trauma followed by loss of  consciousness, no history of meningitis, no history of birth trauma or developmental delay      Family history of epilepsy. Noncontributory      Per chart review: [facility]      Telemedicine Note Dated 08/21/21   -Had a seizure last week/week before. Husband laid across her and had to hold her mouth so she wouldn't swallow her tongue. Briefer than prior spells. On Keppra 1000 mg BID.  -EMU admission for spell capture     Admission Source:  Home  Advance Directives:  None-Discussed  Hospice involvement prior to admission?  Not applicable    Past Medical History:   Diagnosis Date    Depression     Migraine     Multiple sclerosis (CMS HCC)     Seizure (CMS HCC)          Past Surgical History:   Procedure Laterality Date    HX APPENDECTOMY      HX CESAREAN SECTION      HX TONSILLECTOMY           Medications Prior to Admission       Prescriptions    albuterol sulfate (PROVENTIL OR VENTOLIN OR PROAIR) 90 mcg/actuation Inhalation HFA Aerosol Inhaler    Take 1-2 Puffs by inhalation Every 6 hours as needed    amitriptyline (ELAVIL) 10 mg Oral Tablet    Take  1 Tablet (10 mg total) by mouth Every night    ARIPiprazole (ABILIFY) 2 mg Oral Tablet    Take 2 mg by mouth Once a day    azelastine (ASTELIN) 137 mcg (0.1 %) Nasal Aerosol, Spray    USE 1 TO 2 SPRAY(S) IN EACH NOSTRIL ONCE DAILY    cetirizine (ZYRTEC) 10 mg Oral Tablet    Take 1 Tablet by mouth Once a day    cholecalciferol, Vitamin D3, 75 mcg (3,000 unit) Oral Tablet    Take 3,000 Units by mouth Every Monday, Wednesday and Friday    ELDERBERRY FRUIT ORAL    Take by mouth    hydrOXYzine pamoate (VISTARIL) 50 mg Oral Capsule    TAKE 1 CAPSULE BY MOUTH THREE TIMES DAILY AS NEEDED    lamoTRIgine (LAMICTAL) 25 mg Oral Tablet    Take 1 Tablet (25 mg total) by mouth Once a day . Increase per schedule given to goal dose of 2 tablets twice daily.    levETIRAcetam (KEPPRA) 500 mg Oral Tablet    Take 1 Tablet (500 mg total) by mouth Twice daily . Increase to 2 tabs twice daily  in one week.    linaCLOtide (LINZESS) 145 mcg Oral Capsule    Take 1 Capsule by mouth    LORazepam (ATIVAN) 1 mg Oral Tablet    Take 1 mg by mouth    MAYZENT 2 mg Oral Tablet    Take 1 Tablet (2 mg total) by mouth Once a day    MAYZENT STARTER,FOR 2MG  MAINT, 0.25 mg (12 tabs) Oral Tablets, Dose Pack    Take 1 tablet by mouth on days 1 and 2, 2 tablets on day 3, 3 tablets on day 4, and 5 tablets on day 5    mirtazapine (REMERON) 30 mg Oral Tablet    Take 30 mg by mouth Every night    PARoxetine (PAXIL) 20 mg Oral Tablet    Take 20 mg by mouth Once a day    zinc 50 mg Oral Tablet    Take 30 mg by mouth Once a day    zolpidem (AMBIEN) 10 mg Oral Tablet    Take 10 mg by mouth Every night as needed          Allergies   Allergen Reactions    Ceclor [Cefaclor]     Penicillins     Sulfa (Sulfonamides)      Social History     Tobacco Use    Smoking status: Never    Smokeless tobacco: Never   Substance Use Topics    Alcohol use: Yes     Past Family History:   Family Medical History:       Problem Relation (Age of Onset)    Multiple Sclerosis Other              ROS:   Constitutional: Negative for fevers or chills  Eyes: Negative for tearing, or itching of eyes.  Ears, nose, mouth, throat, and face: Negative for loss of hearing, sore throat, or hoarseness.   Respiratory: No shortness of breath, wheezing, or coughing.  Cardiovascular: No chest pain, palpitations, or peripheral edema.  Gastrointestinal: No nausea/vomiting or abdominal pain.  Integument/breast: No rashes, itching.  Hematologic/lymphatic: No history of easy bleeding or bruising.  Musculoskeletal: No weakness or muscle cramping.  Neurological: per HPI  Behavioral/Psych: No anxiety or depression.  Endocrine: No heat/cold intolerance, polyuria, or polydipsia.     Exam:  Temperature: 37 C (98.6 F)  Heart Rate: 65  BP (Non-Invasive): 111/73  Respiratory Rate: 16  General: Appears in good health, no distress  HENT: Head atraumatic and normocephalic  Neck: No  thyromegaly or lymphadenopathy  Carotids: Carotids normal without bruit  Lungs: Clear to auscultation bilaterally.   Cardiovascular: regular rate and rhythm  Abdomen: Soft, non-tender  Extremities: No cyanosis or edema  Ophthalomscopic: optic discs incompletely visualized owing to non dilated pupils  Mental status:  Level of Consciousness: alert  Orientations: Alert and oriented x 3  Memory  Registration, Recall, and Following of commands is normal  Attentions Attention and Concentration are normal  Knowledge: Good  Language: Normal  Speech: Normal  Cranial nerves:   CN2: Visual acuity and fields intact  CN 3,4,6: EOMI, PERRLA  CN 5 Facial sensation intact  CN 7 Face symmetrical  CN 8: Hearing grossly intact  CN 9,10: Palate symmetric and gag normal  CN 11: Sternocleidomastoid and Trapezius have normal strength.  CN 12: Tongue normal with no fasiculations or deviation  Gait, Coordination, and Reflexes:   Gait: Normal  Coordination: Coordination is normal without tremor  Muscle tone: WNL  Muscle exam  Arm Right Left Leg Right Left   Deltoid 5/5 5/5 Iliopsoas 5/5 5/5   Biceps 5/5 5/5 Quads 5/5 5/5   Triceps 5/5 5/5 Hamstrings 5/5 5/5   Wrist Extension 5/5 5/5 Ankle Dorsi Flexion 5/5 5/5   Wrist Flexion 5/5 5/5 Ankle Plantar Flexion 5/5 5/5   Interossei 5/5 5/5 Ankle Eversion 5/5 5/5   APB 5/5 5/5 Ankle Inversion 5/5 5/5       Reflexes   RJ BJ TJ KJ AJ Plantars Hoffman's   Right 2+ 2+ 2+ 2+ 2+ Downgoing Not present   Left 2+ 2+ 2+ 2+ 2+ Downgoing Not present     Sensory: Sensory exam in the upper and lower extremities is normal  Normal color, texture and turgor without significant lesions or rashes  Diabetes Monitors:  No results for input(s): HA1C, GLUCOSEFAST, CHOLESTEROL, HDLCHOL, LDLCHOL, LDLCHOLDIR, TRIG in the last 90240 hours.    Labs:    I have reviewed all lab results.    Review of reports and notes reveal:   Independent Interpretation of images or specimens: None     Assessment/Plan:  Kaitlin Adams is a 60 y.o.  female who presents for elective EMU admission to evaluate for event characterization.  Follows with Dr. Elesa Massed in [facility] clinic.    -Admit to EMU for event characterization.  -Ordered extended vEEG  -Neuro checks and vitals q4h when awake  -Regular diet   -Home AED regimen: Keppra 1 g BID   -Continue home medications   -Anticipated length of stay: 3-5 days  Plan for clinical seizure:  If patient has 4 or more focal seizures within 30 min to 1 hour in a cluster, give ativan 2 mg one time. If patient continues to cluster 4 focal seizures, can give one more dose ativan 2 mg  Give ativan 2 mg for any GTC seizure lasting 5 minutes or greater    Date Day of Stay AED plan   1 09/24/21 Wean Keppra to 500 mg tonight            DNR Status this admission:  Full Code  Palliative/Supportive Care consulted?  no  Hospice Consulted?  Not applicable    Current Comorbid Conditions - Neurology H&P  Compression of the Brain:  No  Obstructive Hydrocephalus:  no  Coma (GCS less than 8): Coma -Not  applicable  TIA not applicable  Encephalopathy:  no  Encephalitis-Not applicable  Seizure-Unspecified    Respiratory Failure/Other-Not applicable  No  Coagulopathy Not applicable  N/A    DVT/PE Prophylaxis: Enoxaparin    Junious Silk, MD  PGY-3 Neurology  Pager [phone]      I did not see the patient on this day    Melynda Ripple MD  Assistant Professor, Neurology  [facility]

## 2021-09-25 DIAGNOSIS — Z5181 Encounter for therapeutic drug level monitoring: Secondary | ICD-10-CM

## 2021-09-25 DIAGNOSIS — Z681 Body mass index (BMI) 19 or less, adult: Secondary | ICD-10-CM

## 2021-09-25 DIAGNOSIS — R569 Unspecified convulsions: Secondary | ICD-10-CM

## 2021-09-25 LAB — ECG 12-LEAD
Atrial Rate: 64 {beats}/min
Calculated P Axis: 72 degrees
Calculated R Axis: 70 degrees
Calculated T Axis: 74 degrees
PR Interval: 140 ms
QRS Duration: 80 ms
QT Interval: 418 ms
QTC Calculation: 431 ms
Ventricular rate: 64 {beats}/min

## 2021-09-25 MED ORDER — SIPONIMOD 2 MG TABLET
2.0000 mg | ORAL_TABLET | Freq: Every day | ORAL | Status: DC
Start: 2021-09-25 — End: 2021-09-25
  Administered 2021-09-25: 09:00:00 0 mg via ORAL

## 2021-09-25 NOTE — Care Plan (Signed)
Veeg initiated overnight. No seizure like activity observed. Tylenol given for mild headache. High fall precautions maintained. VSS.     Problem: Adult Inpatient Plan of Care  Goal: Plan of Care Review  Outcome: Ongoing (see interventions/notes)  Goal: Patient-Specific Goal (Individualized)  Outcome: Ongoing (see interventions/notes)  Goal: Absence of Hospital-Acquired Illness or Injury  Outcome: Ongoing (see interventions/notes)  Intervention: Identify and Manage Fall Risk  Recent Flowsheet Documentation  Taken 09/25/2021 0255 by Milbert Coulter, RN  Safety Promotion/Fall Prevention:   activity supervised   fall prevention program maintained   motion sensor pad activated   nonskid shoes/slippers when out of bed   safety round/check completed  Taken 09/25/2021 0031 by Milbert Coulter, RN  Safety Promotion/Fall Prevention:   activity supervised   fall prevention program maintained   safety round/check completed   nonskid shoes/slippers when out of bed  Taken 09/24/2021 2041 by Milbert Coulter, RN  Safety Promotion/Fall Prevention:   activity supervised   fall prevention program maintained   motion sensor pad activated   nonskid shoes/slippers when out of bed   safety round/check completed  Intervention: Prevent Skin Injury  Recent Flowsheet Documentation  Taken 09/25/2021 0031 by Milbert Coulter, RN  Skin Protection: adhesive use limited  Taken 09/24/2021 2041 by Milbert Coulter, RN  Body Position: positioned/repositioned independently  Skin Protection:   adhesive use limited   transparent dressing maintained  Intervention: Prevent and Manage VTE (Venous Thromboembolism) Risk  Recent Flowsheet Documentation  Taken 09/25/2021 0031 by Milbert Coulter, RN  VTE Prevention/Management:   ambulation promoted   dorsiflexion/plantar flexion performed  Taken 09/24/2021 2041 by Milbert Coulter, RN  VTE Prevention/Management:   ambulation promoted   dorsiflexion/plantar flexion performed  Goal: Optimal Comfort and  Wellbeing  Outcome: Ongoing (see interventions/notes)  Intervention: Provide Person-Centered Care  Recent Flowsheet Documentation  Taken 09/24/2021 2041 by Milbert Coulter, RN  Trust Relationship/Rapport:   care explained   choices provided   emotional support provided   empathic listening provided   questions answered   reassurance provided   thoughts/feelings acknowledged  Goal: Rounds/Family Conference  Outcome: Ongoing (see interventions/notes)     Problem: Health Knowledge, Opportunity to Enhance (Adult,Obstetrics,Pediatric)  Goal: Knowledgeable about Health Subject/Topic  Description: Patient will demonstrate the desired outcomes by discharge/transition of care.  Outcome: Ongoing (see interventions/notes)  Intervention: Enhance Health Knowledge  Recent Flowsheet Documentation  Taken 09/24/2021 2041 by Milbert Coulter, RN  Family/Support System Care: support provided     Problem: Depression  Goal: Improved Mood  Outcome: Ongoing (see interventions/notes)  Intervention: Monitor and Manage Depressive Symptoms  Recent Flowsheet Documentation  Taken 09/24/2021 2041 by Milbert Coulter, RN  Family/Support System Care: support provided     Problem: Skin Injury Risk Increased  Goal: Skin Health and Integrity  Outcome: Ongoing (see interventions/notes)  Intervention: Optimize Skin Protection  Recent Flowsheet Documentation  Taken 09/25/2021 0255 by Milbert Coulter, RN  Pressure Reduction Techniques: frequent weight shift encouraged  Taken 09/25/2021 0031 by Milbert Coulter, RN  Pressure Reduction Techniques: frequent weight shift encouraged  Skin Protection: adhesive use limited  Taken 09/24/2021 2041 by Milbert Coulter, RN  Pressure Reduction Techniques: frequent weight shift encouraged  Skin Protection:   adhesive use limited   transparent dressing maintained  Activity Management: ambulated to bathroom  Head of Bed (HOB) Positioning: HOB at 30-45 degrees     Problem: Seizure, Active Management  Goal: Absence of  Seizure/Seizure-Related Injury  Outcome: Ongoing (see interventions/notes)

## 2021-09-25 NOTE — Progress Notes (Signed)
[facility]  Neurology Progress Note      Kaitlin Adams, Kaitlin Adams, 60 y.o. female  Date of Admission:  09/24/2021  Date of service: 09/25/2021  Date of Birth:  [birth year]      Chief Complaint: Spell Capture    Pt's condition today: stable    Subjective: Patient doing well this morning, no push button events overnight. Per nursing/EMU no events of concern.     Vital Signs:  Temp (24hrs) Max:37 C (98.6 F)      Systolic (24hrs), Avg:108 , Min:105 , Max:111     Diastolic (24hrs), Avg:72, Min:70, Max:73    Temp  Avg: 36.7 C (98.1 F)  Min: 36.4 C (97.5 F)  Max: 37 C (98.6 F)  Pulse  Avg: 61.5  Min: 58  Max: 65  Resp  Avg: 16  Min: 16  Max: 16  SpO2  Avg: 96 %  Min: 96 %  Max: 96 %       Today's Physical Exam:  General: alert  Mental status: Alert and oriented x 3  Memory: Registration, Recall, and Following of commands is normal  Attention: Attention and Concentration are normal  Knowledge: Good  Language and Speech: Normal and Normal  Cranial nerves: Cranial nerves 2-12 are normal  Muscle tone: WNL  Motor strength:  Motor strength is normal throughout.  Sensory: Sensory exam in the upper and lower extremities is normal  Gait: Normal  Coordination: Coordination is normal without tremor  Reflexes: Reflexes are 2/2 throughout      Current Medications:  acetaminophen (TYLENOL) tablet, 650 mg, Oral, Q4H PRN  amitriptyline (ELAVIL) tablet, 10 mg, Oral, NIGHTLY  ARIPiprazole (ABILIFY) tablet, 2 mg, Oral, Daily  diphenhydrAMINE (BENADRYL) capsule, 25 mg, Oral, Q4H PRN  enoxaparin PF (LOVENOX) 40 mg/0.4 mL SubQ injection, 40 mg, Subcutaneous, Daily  LORazepam (ATIVAN) 2 mg/mL injection, 2 mg, Intravenous, Once PRN  LORazepam (ATIVAN) 2 mg/mL injection, 2 mg, Intravenous, Once PRN  LORazepam (ATIVAN) 2 mg/mL injection, 4 mg, Intravenous, Q5 Min PRN  melatonin tablet, 3 mg, Oral, HS PRN  mirtazapine (REMERON) tablet, 30 mg, Oral, NIGHTLY  PARoxetine (PAXIL) tablet, 20 mg, Oral, Daily        I/O:  I/O last 24 hours:     Intake/Output Summary (Last 24 hours) at 09/25/2021 0710  Last data filed at 09/25/2021 0530  Gross per 24 hour   Intake 584 ml   Output --   Net 584 ml     I/O current shift:  No intake/output data recorded.    Labs  Please indicate ordered or reviewed)  No recent labs     Review of reports and notes reveal:   No recent reports     Independent Interpretation of images or specimens:      Patient/ Family Discussion: Dicussed with patient the plan of care, she is agreeable.     Assessment/Plan:  Active Hospital Problems    Diagnosis    Primary Problem: Seizure-like activity (CMS HCC)     Assessment/Plan:    Kaitlin Adams is a 60 y.o. female who presents for elective EMU admission to evaluate for event characterization.  Follows with Dr. Elesa Massed in [facility] clinic.     -Admit to EMU for event characterization.  -Ordered extended vEEG  -Neuro checks and vitals q4h when awake  -Regular diet   -Home AED regimen: Keppra 1 g BID   -Continue home medications   -Anticipated length of stay: 3-5 days  Plan for clinical seizure:  If patient has 4 or more focal seizures within 30 min to 1 hour in a cluster, give ativan 2 mg one time. If patient continues to cluster 4 focal seizures, can give one more dose ativan 2 mg  Give ativan 4 mg for any GTC seizure lasting 5 minutes or greater     Date Day of Stay AED plan   1 09/24/21 Wean Keppra to 500 mg and Lamictal to 25 mg tonight    2 09/25/21   Off Keppra today, monitor for clinical event          DNR Status this admission:  Full Code  Palliative/Supportive Care consulted?  no  Hospice Consulted?  Not applicable     Current Comorbid Conditions  Compression of the Brain:  No  Obstructive Hydrocephalus:  no  Coma (GCS less than 8): Coma -Not applicable  TIA not applicable  Encephalopathy:  no  Encephalitis-Not applicable  Seizure-Unspecified    Respiratory Failure/Other-Not applicable  No  Coagulopathy Not applicable  N/A     DVT/PE Prophylaxis: Enoxaparin      April Manson,  MD   09/25/2021, 07:10  Neurology, PGY-2  [facility]  Pager: [phone]    I saw and examined the patient. I was present and participated in the development of the treatment plan. I reviewed the resident's note. I agree with the findings and plan of care as documented in the resident's note.  Any exceptions/ additions are edited/noted.         Despina Pole, MD

## 2021-09-25 NOTE — Pharmacy (Signed)
Pharmacy Medication Reconciliation  DISCLAIMER: This medication list was collected without a patient interview as noted below. Please be aware that updates to the medication list may be necessary per the discretion of the primary team if the patient is able to be interviewed at a later time.       Patient Name: Cyrilla, Durkin  Date of Service: 09/25/2021  Date of Admission: 09/24/2021  Date of Birth: Jan 07, 1961  Length of Stay:   0 days   Service: NEUROLOGY 1      Transitions of Care:  Discharge Pharmacy services were discussed with the patient and the Meds to [facility] flowsheet and preferred pharmacy information were updated in EMR if applicable and able to assess with patient.    Information was collected from:  Patient, Pharmacy, and Caregiver    - [facility] - [phone]    - Florentina Addison (husband) - [phone]    I spoke with Lyla Son (patient) she was not able to tell me what medications she took at home. Ikesha asked me to call her pharmacy [facility]) that they would have everything she takes. Kaeleigh also stated that I could call her husband French Ana but she didn't think he would know any of her medications. I did call French Ana (husband) and he stated that he does not go through her things so he was not able to confirm what [facility] stated.French Ana was able to go over with me a few bottles of medications he found but could not confirm if she took them for sure.        Clarified Prior to Admission Medications:  Prior to Admission medications    Medication Sig Taking Resumed Y/N (RPh) Comments   albuterol sulfate (PROVENTIL OR VENTOLIN OR PROAIR) 90 mcg/actuation Inhalation HFA Aerosol Inhaler Take 1-2 Puffs by inhalation Every 6 hours as needed     No Pharmacy Fill History             amitriptyline (ELAVIL) 10 mg Oral Tablet Take 1 Tablet (10 mg total) by mouth Every night   Y - however unsure if patient is taking given pharmacy fill history.  Pharmacy [facility]) reports last fill date for Amitriptyline  (ELAVIL) 25 mg was on 05/21/21 for # 30 for a 30 day supply    No record of Amitriptyline (ELAVIL) 10 MG       ARIPiprazole (ABILIFY) 2 mg Oral Tablet Take 2 mg by mouth Once a day   Y - appears patient is taking  Pharmacy [facility]) reports last fill date was on 09/24/21 for # 30 for a 30 day supply         azelastine (ASTELIN) 137 mcg (0.1 %) Nasal Aerosol, Spray USE 1 TO 2 SPRAY(S) IN EACH NOSTRIL ONCE DAILY     Pharmacy [facility]) reports last fill date was on 05/22/21 for a 50 day supply           cetirizine (ZYRTEC) 10 mg Oral Tablet Take 1 Tablet by mouth Once a day   N - however it appears patient is taking this at home  Pharmacy [facility]) reports last fill date was on 09/08/21 for # 90 for a 90 day supply         cholecalciferol, Vitamin D3, 75 mcg (3,000 unit) Oral Tablet Take 3,000 Units by mouth Every Monday, Wednesday and Friday        ELDERBERRY FRUIT ORAL Take by mouth        hydrOXYzine pamoate (VISTARIL) 50 mg Oral Capsule  TAKE 1 CAPSULE BY MOUTH THREE TIMES DAILY AS NEEDED   No - however it appears patient is taking  Pharmacy [facility]) reports last fill date was on 09/24/21 for # 90 for a 30 day supply         lamoTRIgine (LAMICTAL) 25 mg Oral Tablet Take 1 Tablet (25 mg total) by mouth Once a day . Increase per schedule given to goal dose of 2 tablets twice daily.   No - unsure of patient compliance and whether she increased dose at all  Pharmacy [facility]) reports last fill date was on 07/08/21 for # 120 for a 30 day supply           levETIRAcetam (KEPPRA) 500 mg Oral Tablet Take 1 Tablet (500 mg total) by mouth Twice daily . Increase to 2 tabs twice daily in one week.  Patient taking differently: Take 2 Tablets (1,000 mg total) by mouth Once a day . Increase to 2 tabs twice daily in one week.   No - again, unsure of patient compliance and how she is currently taking this medication  Pharmacy [facility]) reports last fill date was on 08/11/21 for # 120 for a 30 day supply                 linaCLOtide  (LINZESS) 145 mcg Oral Capsule Take 1 Capsule by mouth   No  No Pharmacy Fill History         LORazepam (ATIVAN) 1 mg Oral Tablet Take 1 Tablet (1 mg total) by mouth Three times a day   Yes - not scheduled - for seizure activity only.  Does appear patient is taking this medication  Pharmacy [facility]) reports last fill date was on 09/01/21 for # 90 for a 30 day supply         MAYZENT 2 mg Oral Tablet Take 1 Tablet (2 mg total) by mouth Once a day   No - does not appear patient started this medication  No Pharmacy Fill History         MAYZENT STARTER,FOR 2MG  MAINT, 0.25 mg (12 tabs) Oral Tablets, Dose Pack Take 1 tablet by mouth on days 1 and 2, 2 tablets on day 3, 3 tablets on day 4, and 5 tablets on day 5   No - does not appear patient started this medication  No Pharmacy Fill History           mirtazapine (REMERON) 30 mg Oral Tablet Take 30 mg by mouth Every night  Patient not taking: Reported on 09/24/2021   Yes - patient appears to be using this medication  Pharmacy [facility]) reports last fill date was on 09/24/21 for # 30 for a 30 day supply             PARoxetine (PAXIL) 20 mg Oral Tablet Take 20 mg by mouth Once a day   Yes - patient appears to be taking this medication  Pharmacy [facility]) reports last fill date was on 09/24/21 for # 30 for a 30 day supply         zinc 50 mg Oral Tablet Take 30 mg by mouth Once a day        zolpidem (AMBIEN) 10 mg Oral Tablet Take 10 mg by mouth Every night as needed  No - however it appears patient is taking this medication  Pharmacy [facility]) reports last fill date was on 09/07/21 for # 30 for a 30 day supply  Did patient's home medication list require updates? No    Medications UPDATED on Prior to Admission Med List:  NONE      Medications ADDED to Prior to Admission Med List:  NONE    Did pharmacist make suggestions for medication reconciliation? Yes    Medications REMOVED from home medication list:  Albuterol inhaler  Azelastine nasal spray  Lamictal - given  documentation that she did not tolerate  Linzess    Pharmacist Recommendations:  Given patient's and husband's poor knowledge of medications would encourage patient to bring all medications to her follow up appointment for complete medication history and medication list update.      Consider discontinuing amitriptyline as it is unlikely patient is currently taking.      Mayzent left on patient list for now given discussion with neurology team that indicated they will attempt to have patient start medication during hospitalization or at follow up visit.      Allergies:    Allergies   Allergen Reactions   . Latex    . Ceclor [Cefaclor]    . Penicillins    . Sulfa (Sulfonamides)        Lissa Hoard, Pharmacy Technician    ATTENTION PHARMACIST: Delete this and replace with typing ".medrec" to pull in needed pharmacist question.

## 2021-09-25 NOTE — Procedures (Addendum)
[facility]    Neurodiagnostics Report    Date of Service:  09/27/21   Patient Name: Kaitlin Adams   Medical Record Number: [id number]   DOB:  February 21, 1961    Age: 60 y.o.       Gender: female     COMPUTER ASSISTED DIGITAL VIDEO EEG MONITORING  EEG Number: [id number]  DATES and TIMES of PROCEDURE:  From 09/27/2021 at 08:00 to 09/28/2021 at 011:22    HISTORY:    The patient is  a 60 y.o. female who presents for elective EMU admission to evaluate for event characterization. VEEG is to evaluate for seizures.  Antiseizure medications: Off Keppra.    TECHNICAL DESCRIPTION:    This Armed forces logistics/support/administrative officer Video EEG was recorded using 21 scalp electrodes. It was reviewed daily in referential and bipolar montages following reformatting in the 10[employer] electrode placement system.  Spike and seizure detection algorithms were employed.  The patient was continuously monitored on video. Staff monitored the patient and maintained a record of routine and suspicious events. When appropriate, staff stimulated, interrogated and/or tested the patient regularly and during spells or other activities.      EEG FINDINGS:  Background:      Occipital rhythm (posterior dominant rhythm, or PDR):  present    Frequency: 10-11 Hz    Voltage: medium   Organization: poor   Reactivity to eye opening/closure: good     Sleep: During drowsiness there was buildup of approximately symmetric theta and delta slowing centrally and temporally, as well as frontal and central beta, slow rolling eye movements, and drop out of the alpha. Stages 1 and 2 of sleep included approximately symmetric vertex sharp transients, sleep spindles and K-complexes in the central head regions.    Technical and Activation Procedures:    Photic stimulation: not performed    Hyperventilation: not performed            Abnormalities:  I. Seizures?      No   II. Rhythmic or Periodic Patterns?      No          III. Other Abnormalities?    yes            EEG  Classification: Abnormal  Epileptiform abnormalities:      1) Bursts of sharp waves:  There are bursts of generalized irregular, sharply contoured theta-delta waves with shifting polarity, at times, max at  Left frontal and left posterior region during wakefulness and drowsiness.   Non-Epileptiform abnormalities: NA     Paroxymal events:   Events #2, 3, 4, 5, 6at 8:04,08:20. 08;25, 09:18, 09:27, 09:51, 09:56  Clinical description: the patient was in bed, stated seeing flashes of lights/color, from right eye for few seconds with intact awareness. She was emotional and crying. At one event she said her mouth tasted salty. It seems it sometimes disappear when she look with her left eye.  EEG description: No EEG correlate.       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    Multiple push button event for having of visual aura of flashes of light in her right eye were captured without EEG correlate. None of the habitual events that progress into full body shaking and confusion were captured during  this visit. This does not preclude epilepsy.      ================   I have reviewed the EEG and agree with the findings of this report. Any additions, exceptions or clarifications are noted above.    Jaquita Folds, MD    ======================================================================================================================================  Date of Service:  09/26/21   Patient Name: Kaitlin Adams   Medical Record Number: [id number]   DOB:  1961/01/09    Age: 60 y.o.       Gender: female     COMPUTER ASSISTED DIGITAL VIDEO EEG MONITORING  EEG Number: [id number]  DATES and TIMES of PROCEDURE:  From 09/26/2021 at 08:00 to 09/26/2021 at 08:00     HISTORY:    The patient is   a 60 y.o. female who presents for elective EMU admission to evaluate for event characterization. VEEG is to evaluate for seizures.  Antiseizure medications: Off Keppra.    TECHNICAL DESCRIPTION:    This Armed forces logistics/support/administrative officer Video EEG was recorded using 21 scalp electrodes. It was reviewed daily in referential and bipolar montages following reformatting in the 10-20 International electrode placement system.  Spike and seizure detection algorithms were employed.  The patient was continuously monitored on video. Staff monitored the patient and maintained a record of routine and suspicious events. When appropriate, staff stimulated, interrogated and/or tested the patient regularly and during spells or other activities.      EEG FINDINGS:  Background:      Occipital rhythm (posterior dominant rhythm, or PDR):  present    Frequency: 10-11 Hz    Voltage: medium   Organization: poor   Reactivity to eye opening/closure: good     Sleep: During drowsiness there was buildup of approximately symmetric theta and delta slowing centrally and temporally, as well as frontal and central beta, slow rolling eye movements, and drop out of the alpha. Stages 1 and 2 of sleep included approximately symmetric vertex sharp transients, sleep spindles and K-complexes in the central head regions.      Technical and Activation Procedures:    Photic stimulation: not performed    Hyperventilation: not performed              Abnormalities:    I. Seizures?      No   II. Rhythmic or Periodic Patterns?      No          III. Other Abnormalities?    No      EEG Classification: normal  Epileptiform abnormalities: NA     1) Bursts of sharp waves:  There are bursts of generalized irregular, sharply contoured theta-delta waves with shifting polarity, at times, max at  Left frontal and left posterior region during wakefulness and drowsiness.     Non-Epileptiform abnormalities: NA    Paroxymal events:   Event#1 at 17:36  Clinical description: the patient  was in bed, stated seeing flashes of lights/color, from right eye, she was emotional and crying lasting for 30-60 seconds.   EEG description: No EEG correlate.     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  One event of flashes of light in her right eye were captured without EEG correlate. This does not preclude epilepsy.    ================   I have reviewed the EEG and agree with the findings of this report.  Any additions, exceptions or clarifications are noted above.    Jaquita Folds, MD      ======================================================================================================================================  Continuous Video EEG Report    Saanvi Hakala - 60 y.o. female   Attending provider : Despina Pole, MD    Indication: Seizure quantification    EEG # : [id number]  Acquisition : digitally acquired long-term video EEG  Headset : international 10-20 system  Technician : Mariea Stable    ---------------------------------------------------------------------    Reporting period 2 : 09/25/21 0800 to 09/26/2021 0800  Medications:  No ASMs or sedatives    DESCRIPTION    Background  Frequency composition : Alpha admixed with Beta   Posterior dominant rhythm : 10.5 - 11 Hz  Architecture : symmetric, continuous, well-organized    State : stage N2 sleep evidenced by sleep spindles and presence of K complexes     Activation procedures  Photic stimulation: symmetric driving response  Hyperventilation: not performed      Interictal abnormalities  Excessive Beta activity       Seizures  None     Clinical Spells  None       INTERPRETATION  Diffuse excess beta activity suggesting effect of sedative medications.  No seizures or epileptiform discharges were seen.    Clinical correlation  required.      April Manson, MD   Neurology, PGY-2  [facility]    ________________________________________________________________________    Continuous Video EEG Report    Mertha Finders - 60 y.o. female   Attending provider : Despina Pole, MD    Indication: Seizure quantification    EEG # : [id number]  Acquisition : digitally acquired long-term video EEG  Headset : international 10-20 system  Technician : Mariea Stable    ---------------------------------------------------------------------      Reporting period 1 : 09/24/21 17:37 to 09/25/2021 0800    Medications:  No Sedatives, Keppra     DESCRIPTION    Background  Frequency composition : Alpha with theta and excess beta activity   Posterior dominant rhythm :  10 Hz  Architecture : symmetric, continuous, well-organized    State : stage N2 sleep evidenced by sleep spindles and K complexes      Activation procedures  Photic stimulation: not performed  Hyperventilation: not performed      Interictal abnormalities  None       Seizures  None     Clinical Spells  None         INTERPRETATION  Diffuse excess beta activity suggesting effect of sedative medications.  No seizures or epileptiform discharges were seen.    Clinical correlation required.      April Manson, MD   Neurology, PGY-2  [facility]      I have reviewed the above EEG with Dr Shawn Stall and the above represents my personal impression.    Despina Pole, MD  09/25/2021, 11:39

## 2021-09-25 NOTE — Care Management Notes (Signed)
[facility]  Care Management Initial Evaluation    Patient Name: Kaitlin Adams  Date of Birth: [birth year]  Sex: female  Date/Time of Admission: 09/24/2021  5:09 PM  Room/Bed: 1069/A  Payor: Layla Maw MEDICAID / Plan: Layla Maw [place] MEDICAID / Product Type: Medicaid MC /   Primary Care Providers:  Jetty Duhamel, PA-C (General)    Pharmacy Info:   Preferred Pharmacy       [facility] [address] [place] - [address]    [address] [place] [address]    Phone: [phone] Fax: [phone]    Hours: Not open 24 hours    [facility]    [address] [place] [address]    Phone: [phone] Fax: [phone]    Hours: Monday-Friday 8AM-6PM, Saturday & Sunday Closed          Emergency Contact Info:   Extended Emergency Contact Information  Primary Emergency Contact: Tracey Lust  Mobile Phone: [phone]  Relation: Husband    History:   Arayna Macmullen is a 60 y.o., female, admitted with spells    Height/Weight: 160 cm (5\' 3") / 50.5 kg (111 lb 6.4 oz)     LOS: 0 days   Admitting Diagnosis: Seizure-like activity (CMS HCC) [R56.9]    Assessment:      10 /27/22 1555   Assessment Details   Assessment Type Admission   Date of Care Management Update 09/25/21   Date of Next DCP Update 09/26/21   Readmission   Is this a readmission? No   Insurance Information/Type   Insurance type Medicaid   Employment/Financial   Patient has Prescription Coverage?  Yes        Name of Insurance Coverage for Medications [employer]   Financial Concerns none   Living Environment   Select an age group to open "lives with" row.  Adult   Lives With spouse   Living Arrangements house   Able to Return to Prior Arrangements yes   Home Safety   Home Assessment: No Problems Identified   Care Management Plan   Discharge Planning Status initial meeting   Projected Discharge Date 09/27/21   CM will evaluate for rehabilitation potential yes   Discharge Needs Assessment    Equipment Currently Used at Home none   Equipment Needed After Discharge none   Discharge Facility/Level of Care Needs Home (Patient/Family Member/other)(code 1)   Transportation Available Medicaid transportation   Referral Information   Admission Type observation   Address Verified verified-no changes   Arrived From home or self-care   ADVANCE DIRECTIVES   Does the Patient have an Advance Directive? No, Information Offered and Refused   LAY CAREGIVER    Appointed Lay Caregiver? I Decline       Discharge Plan:  Home (Patient/Family Member/other) (code 1)  60yo female admitted with spells to EMU for cont VEEG monitoring for spell capture. Patient lives with husband, independent for all ADLs, no problems with home set-up, no medical equipment or HH services used. PCP and pharmacy verified. No advance directive. She will need transportation at d/c, does have Medicaid, will arrange through [facility]. No other d/c needs identified at this time.    The patient will continue to be evaluated for developing discharge needs.     Case Manager: [facility], CLINICAL CARE COORDINATOR  Phone: Cynda [place]res

## 2021-09-26 MED ORDER — ZOLPIDEM 5 MG TABLET
10.0000 mg | ORAL_TABLET | Freq: Once | ORAL | Status: AC
Start: 2021-09-26 — End: 2021-09-26
  Administered 2021-09-26: 01:00:00 10 mg via ORAL
  Filled 2021-09-26: qty 2

## 2021-09-26 NOTE — Nurses Notes (Signed)
Service not ordering Ambien at this time, notified pt, pt very upset stated 'then I won't be able to sleep'. Pt aware she has order for Melatonin, agreeable to take Melatonin.

## 2021-09-26 NOTE — Nurses Notes (Signed)
Pt requesting Ambien 10 mg for sleep, states this is her home med. Paged service.

## 2021-09-26 NOTE — Nurses Notes (Signed)
Video EEG maintained, no events noted or reported.

## 2021-09-26 NOTE — Nurses Notes (Signed)
Pt feels as if the smell of the hospital hand sanitizer and hand soap is contributing to her 'spells'. Offered pt unscented wipes.

## 2021-09-26 NOTE — Care Management Notes (Signed)
[facility]  Care Management Note    Patient Name: Kaitlin Adams  Date of Birth: 02/11/1961  Sex: female  Date/Time of Admission: 09/24/2021  5:09 PM  Room/Bed: 1069/A  Payor: Layla Maw MEDICAID / Plan: Layla Maw [place] MEDICAID / Product Type: Medicaid [place] /    LOS: 0 days   Primary Care Providers:  Aleene Davidson, PA-C, PA-C (General)    Admitting Diagnosis:  Seizure-like activity (CMS [facility]) [R56.9]    Assessment:      09/26/21 1208   Assessment Details   Assessment Type Continued Assessment   Date of Care Management Update 09/26/21   Date of Next DCP Update 09/29/21   Care Management Plan   Discharge Planning Status plan in progress   Projected Discharge Date 09/27/21   Discharge Needs Assessment   Discharge Facility/Level of Care Needs Home (Patient/Family Member/other)(code 1)   Transportation Available Medicaid transportation       Discharge Plan:  Home (Patient/Family Member/other) (code 1)  Patient remains in EMU on cont VEEG monitoring for spell capture - none noted thus far. Once medically ready, patient will need transport arranged through [facility]. Task sent to weekend CM staff to follow for this.    The patient will continue to be evaluated for developing discharge needs.     Case Manager: Cynda [place], CLINICAL CARE COORDINATOR  Phone: [phone]

## 2021-09-26 NOTE — Progress Notes (Signed)
[facility]  Neurology Progress Note      Kaitlin Adams, Kaitlin Adams, 60 y.o. female  Date of Admission:  09/24/2021  Date of service: 09/26/2021  Date of Birth:  1961-11-06      Chief Complaint: Spell Capture    Pt's condition today: stable    Subjective: Patient doing well this morning, no push button events overnight. Per nursing/EMU no events of concern.     Vital Signs:  Temp (24hrs) Max:36.6 C (97.9 F)      Systolic (24hrs), Avg:108 , Min:97 , Max:117     Diastolic (24hrs), Avg:70, Min:65, Max:73    Temp  Avg: 36.6 C (97.8 F)  Min: 36.5 C (97.7 F)  Max: 36.6 C (97.9 F)  Pulse  Avg: 59.3  Min: 57  Max: 63  Resp  Avg: 17  Min: 17  Max: 17  SpO2  Avg: 98 %  Min: 97 %  Max: 99 %       Today's Physical Exam:  General: alert  Mental status: Alert and oriented x 3  Memory: Registration, Recall, and Following of commands is normal  Attention: Attention and Concentration are normal  Knowledge: Good  Language and Speech: Normal and Normal  Cranial nerves: Cranial nerves 2-12 are normal  Muscle tone: WNL  Motor strength:  Motor strength is normal throughout.  Sensory: Sensory exam in the upper and lower extremities is normal  Gait: Normal  Coordination: Coordination is normal without tremor  Reflexes: Reflexes are 2/2 throughout      Current Medications:  acetaminophen (TYLENOL) tablet, 650 mg, Oral, Q4H PRN  amitriptyline (ELAVIL) tablet, 10 mg, Oral, NIGHTLY  ARIPiprazole (ABILIFY) tablet, 2 mg, Oral, Daily  diphenhydrAMINE (BENADRYL) capsule, 25 mg, Oral, Q4H PRN  enoxaparin PF (LOVENOX) 40 mg/0.4 mL SubQ injection, 40 mg, Subcutaneous, Daily  LORazepam (ATIVAN) 2 mg/mL injection, 2 mg, Intravenous, Once PRN  LORazepam (ATIVAN) 2 mg/mL injection, 2 mg, Intravenous, Once PRN  LORazepam (ATIVAN) 2 mg/mL injection, 4 mg, Intravenous, Q5 Min PRN  melatonin tablet, 3 mg, Oral, HS PRN  mirtazapine (REMERON) tablet, 30 mg, Oral, NIGHTLY  PARoxetine (PAXIL) tablet, 20 mg, Oral, Daily        I/O:  I/O last 24 hours:       Intake/Output Summary (Last 24 hours) at 09/26/2021 0716  Last data filed at 09/25/2021 1200  Gross per 24 hour   Intake 780 ml   Output --   Net 780 ml     I/O current shift:  No intake/output data recorded.    Labs  Please indicate ordered or reviewed)  No recent labs     Review of reports and notes reveal:   No recent reports     Independent Interpretation of images or specimens:      Patient/ Family Discussion: Dicussed with patient the plan of care, she is agreeable.     Assessment/Plan:  Active Hospital Problems    Diagnosis    Primary Problem: Seizure-like activity (CMS HCC)     Assessment/Plan:    Kaitlin Adams is a 60 y.o. female who presents for elective EMU admission to evaluate for event characterization.  Follows with Dr. Elesa Massed in In Neurology clinic.     -Admit to EMU for event characterization.  -Ordered extended vEEG  -Neuro checks and vitals q4h when awake  -Regular diet   -Home AED regimen: Keppra 1 g BID   -Continue home medications   -Anticipated length of stay: 3-5 days  Plan for clinical  seizure:  If patient has 4 or more focal seizures within 30 min to 1 hour in a cluster, give ativan 2 mg one time. If patient continues to cluster 4 focal seizures, can give one more dose ativan 2 mg  Give ativan 4 mg for any GTC seizure lasting 5 minutes or greater     Date Day of Stay AED plan   1 09/24/21 Wean Keppra to 500 mg and Lamictal to 25 mg tonight    2 09/25/21   Off Keppra today, monitor for clinical event    3 09/26/21 Photic yesterday, no spells captured, will continue to monitor          DNR Status this admission:  Full Code  Palliative/Supportive Care consulted?  no  Hospice Consulted?  Not applicable     Current Comorbid Conditions  Compression of the Brain:  No  Obstructive Hydrocephalus:  no  Coma (GCS less than 8): Coma -Not applicable  TIA not applicable  Encephalopathy:  no  Encephalitis-Not applicable  Seizure-Unspecified    Respiratory Failure/Other-Not applicable  No  Coagulopathy Not  applicable  N/A     DVT/PE Prophylaxis: Enoxaparin      Kaitlin Manson, MD   09/26/2021, 07:16  Neurology, PGY-2  [facility]  Pager: [phone]    I saw and examined the patient. I was present and participated in the development of the treatment plan. I reviewed the resident's note. I agree with the findings and plan of care as documented in the resident's note.  Any exceptions/ additions are edited/noted.         Despina Pole, MD

## 2021-09-26 NOTE — Nurses Notes (Signed)
jPt called me in room. Stated when flashing lights happened at 1730 she had a differt smell than she gets at home and when food came just now that same different smell she can taste on her food and smell on h er hands. Even thought the flashy lights went away at 1742 the smell and taste is still there.

## 2021-09-27 DIAGNOSIS — Z7901 Long term (current) use of anticoagulants: Secondary | ICD-10-CM

## 2021-09-27 MED ORDER — HYDROXYZINE PAMOATE 25 MG CAPSULE
25.0000 mg | ORAL_CAPSULE | ORAL | Status: AC
Start: 2021-09-27 — End: 2021-09-27
  Administered 2021-09-27: 03:00:00 25 mg via ORAL
  Filled 2021-09-27: qty 1

## 2021-09-27 NOTE — Nurses Notes (Signed)
During assessment, pt stated "I can't pee. I've been holding it all day because I didn't want anyone to get mad at me". Discussed with pt that staff will not get mad at her for requesting assistance to the bathroom. Assisted pt to bathroom, pt voided without difficulty.

## 2021-09-27 NOTE — Care Plan (Signed)
Pt is and elective EMU pt. On VEEG. Pt is alert and oriented. Not on sitter select. Pt calls out for assistance. Pt has had numerous events of flashing lights. Pt becomes very anxious and tearful. Emotional support given pt during these events.

## 2021-09-27 NOTE — Nurses Notes (Signed)
Pt c/o not being able to sleep. Notified service pt unable to sleep and day shift resident did not want to order pt's request for Ambien.

## 2021-09-27 NOTE — Progress Notes (Signed)
[facility]  Neurology Progress Note      Trinka, Kaitlin Adams, 60 y.o. female  Date of Admission:  09/24/2021  Date of service: 09/27/2021  Date of Birth:  [birth year]      Chief Complaint: Spell Capture    Pt's condition today: stable    Subjective:   One spell captured overnight.    Vital Signs:  Temp (24hrs) Max:36.9 C (98.4 F)      Systolic (24hrs), Avg:115 , Min:110 , Max:121     Diastolic (24hrs), Avg:77, Min:70, Max:84    Temp  Avg: 36.8 C (98.2 F)  Min: 36.6 C (97.9 F)  Max: 36.9 C (98.4 F)  Pulse  Avg: 64.3  Min: 64  Max: 65  Resp  Avg: 17  Min: 17  Max: 17  SpO2  Avg: 98.7 %  Min: 98 %  Max: 99 %       Today's Physical Exam:  General: alert  Mental status: Alert and oriented x 3  Memory: Registration, Recall, and Following of commands is normal  Attention: Attention and Concentration are normal  Knowledge: Good  Language and Speech: Normal and Normal  Cranial nerves: Cranial nerves 2-12 are normal  Muscle tone: WNL  Motor strength:  Motor strength is normal throughout.  Sensory: Sensory exam in the upper and lower extremities is normal  Gait: Normal  Coordination: Coordination is normal without tremor  Reflexes: Reflexes are 2/2 throughout      Current Medications:  acetaminophen (TYLENOL) tablet, 650 mg, Oral, Q4H PRN  amitriptyline (ELAVIL) tablet, 10 mg, Oral, NIGHTLY  ARIPiprazole (ABILIFY) tablet, 2 mg, Oral, Daily  diphenhydrAMINE (BENADRYL) capsule, 25 mg, Oral, Q4H PRN  enoxaparin PF (LOVENOX) 40 mg/0.4 mL SubQ injection, 40 mg, Subcutaneous, Daily  LORazepam (ATIVAN) 2 mg/mL injection, 2 mg, Intravenous, Once PRN  LORazepam (ATIVAN) 2 mg/mL injection, 2 mg, Intravenous, Once PRN  LORazepam (ATIVAN) 2 mg/mL injection, 4 mg, Intravenous, Q5 Min PRN  melatonin tablet, 3 mg, Oral, HS PRN  mirtazapine (REMERON) tablet, 30 mg, Oral, NIGHTLY  PARoxetine (PAXIL) tablet, 20 mg, Oral, Daily        I/O:  I/O last 24 hours:      Intake/Output Summary (Last 24 hours) at 09/27/2021 0749  Last data  filed at 09/26/2021 1900  Gross per 24 hour   Intake 600 ml   Output --   Net 600 ml     I/O current shift:  No intake/output data recorded.    Labs  Please indicate ordered or reviewed)  No recent labs     Review of reports and notes reveal:   No recent reports     Independent Interpretation of images or specimens:      Patient/ Family Discussion: Dicussed with patient the plan of care, she is agreeable.     Assessment/Plan:  Active Hospital Problems    Diagnosis   . Primary Problem: Seizure-like activity (CMS HCC)     Assessment/Plan:    Kaitlin Adams is a 60 y.o. female who presents for elective EMU admission to evaluate for event characterization.  Follows with Dr. Elesa Massed in In Neurology clinic.    -Admit to EMU for event characterization.  -Ordered extended vEEG  -Neuro checks and vitals q4h when awake  -Regular diet   -Home AED regimen: Keppra 1 g BID   -Continue home medications   -Anticipated length of stay: 3-5 days  Plan for clinical seizure:  1. If patient has 4 or more focal seizures within  30 min to 1 hour in a cluster, give ativan 2 mg one time. If patient continues to cluster 4 focal seizures, can give one more dose ativan 2 mg  2. Give ativan 2 mg for any GTC seizure lasting 5 minutes or greater    Date Day of Stay AED plan   1 09/24/21 . Wean Keppra to 500 mg and Lamictal to 25 mg tonight   2 09/25/21 . Off Keppra today, monitor for clinical event    3 09/26/21 . Photic yesterday, no spells captured, will continue to monitor    4 09/27/21 . One spell captured overnight, off ASMs       DNR Status this admission:  Full Code  Palliative/Supportive Care consulted?  no  Hospice Consulted?  Not applicable    Current Comorbid Conditions  Compression of the Brain:  No  Obstructive Hydrocephalus:  no  Coma (GCS less than 8): Coma -Not applicable  TIA not applicable  Encephalopathy:  no  Encephalitis-Not applicable  Seizure-Unspecified    Respiratory Failure/Other-Not applicable  No  Coagulopathy Not  applicable  N/A    DVT/PE Prophylaxis: Enoxaparin      Junious Silk, MD  PGY-3 Neurology  Pager [phone]    I saw and examined the patient.  I reviewed the resident's note.  I agree with the findings and plan of care as documented in the resident's note.  Any exceptions/additions are edited/noted.    Melina Modena, MD

## 2021-09-28 MED ORDER — LEVETIRACETAM 500 MG/5 ML INTRAVENOUS SOLUTION
1000.0000 mg | INTRAVENOUS | Status: AC
Start: 2021-09-28 — End: 2021-09-28
  Administered 2021-09-28: 11:00:00 1000 mg via INTRAVENOUS
  Filled 2021-09-28: qty 10

## 2021-09-28 NOTE — Discharge Summary (Addendum)
[facility]  DISCHARGE SUMMARY    PATIENT NAME:  Kaitlin Adams, Kaitlin Adams  MRN:  [id number]  DOB:  1961/09/18    ENCOUNTER DATE:  09/24/2021  INPATIENT ADMISSION DATE: 09/24/2021  DISCHARGE DATE:  09/28/2021    ATTENDING PHYSICIAN: Despina Pole, MD  SERVICE: NEUROLOGY 1  PRIMARY CARE PHYSICIAN: Aleene Davidson, PA-C       LAY CAREGIVER:  ,  ,        PRIMARY DISCHARGE DIAGNOSIS: Seizure-like activity (CMS [facility])  Active Hospital Problems    Diagnosis Date Noted    Principle Problem: Seizure-like activity (CMS [facility]) [R56.9] 09/24/2021      Resolved Hospital Problems   No resolved problems to display.     There are no active non-hospital problems to display for this patient.       DISCHARGE MEDICATIONS:     Current Discharge Medication List      CONTINUE these medications - NO CHANGES were made during your visit.      Details   amitriptyline 10 mg Tablet  Commonly known as: ELAVIL   10 mg, Oral, NIGHTLY  Qty: 30 Tablet  Refills: 5     ARIPiprazole 2 mg Tablet  Commonly known as: ABILIFY   2 mg, Oral, DAILY  Refills: 0     cetirizine 10 mg Tablet  Commonly known as: ZYRTEC   1 Tablet, Oral, DAILY  Refills: 0     cholecalciferol (Vitamin D3) 75 mcg (3,000 unit) Tablet   3,000 Units, Oral, EVERY MO, WE AND FR  Refills: 0     ELDERBERRY FRUIT ORAL   Oral  Refills: 0     hydrOXYzine pamoate 50 mg Capsule  Commonly known as: VISTARIL   TAKE 1 CAPSULE BY MOUTH THREE TIMES DAILY AS NEEDED  Refills: 0     levETIRAcetam 500 mg Tablet  Commonly known as: KEPPRA   1,000 mg, Oral, 2 TIMES DAILY  Refills: 0     LORazepam 1 mg Tablet  Commonly known as: ATIVAN   1 mg, Oral, 3 TIMES DAILY  Refills: 0     * Mayzent Starter(for 2mg  maint) 0.25 mg (12 tabs) Tablets, Dose Pack  Generic drug: siponimod   Take 1 tablet by mouth on days 1 and 2, 2 tablets on day 3, 3 tablets on day 4, and 5 tablets on day 5  Qty: 12 Tablet  Refills: 0     * Mayzent 2 mg Tablet  Generic drug: siponimod   Take 1 Tablet (2 mg total) by mouth Once a day  Qty: 30  Tablet  Refills: 11     mirtazapine 30 mg Tablet  Commonly known as: REMERON   30 mg, Oral, NIGHTLY  Refills: 0     PARoxetine 20 mg Tablet  Commonly known as: PAXIL   20 mg, Oral, DAILY  Refills: 0     zinc 50 mg Tablet   30 mg, Oral, DAILY  Refills: 0     zolpidem 10 mg Tablet  Commonly known as: AMBIEN   10 mg, Oral, NIGHTLY PRN  Refills: 0         * This list has 2 medication(s) that are the same as other medications prescribed for you. Read the directions carefully, and ask your doctor or other care provider to review them with you.              Discharge med list refreshed?  YES       ALLERGIES:  Allergies  Allergen Reactions    Latex     Ceclor [Cefaclor]     Penicillins     Sulfa (Sulfonamides)        HOSPITAL PROCEDURE(S):   Bedside Procedures:  No orders of the defined types were placed in this encounter.    Surgical       REASON FOR HOSPITALIZATION AND HOSPITAL COURSE     BRIEF HPI:    Emilygrace Grothe is a 60 y.o. female who presents for elective EMU admission to evaluate for event characterization.  Follows with Dr. Elesa Massed in In Neurology clinic.    Clinic Note dated 03/27/21  -2015: Numbness in distal lower extremities, progressed to ankles and now to knees. Got MRI and LP. Started on Gilenya.   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 Due to concern for an MS exacerbation, an MRI of the brain and C-spine with and without contrast was obtained and did not show any new or active lesions. There was an area of potential enhancement at T10, though this was favored to represent artifact. 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 The patient has had a normal EEG in the past. However, a routine EEG was repeated and did not show any seizure activity. Given reported history of prior seizures, she will be continued on Depakote at discharge".    Current AED medications with doses:  Keppra 1000 mg BID    Previous AED  Depakote 250 mg BID - dizziness   Lamictal 25 mg - dizziness, nausea, disorientation    BRIEF HOSPITAL NARRATIVE:      Patient was continuously monitored on EEG and Keppra was weaned since admission.    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  Multiple push button event for having of visual aura of flashes of light in her right eye were captured without EEG correlate. None of the habitual events that progress to body shaking and confusion were captured during this visit. This does not preclude epilepsy.    Photic stimulation was performed which did not any elicit abnormalities.    On day 5 of EMU stay, patient requested that she wanted to go home and that she needs to take care of her husband at home, hence patient was discharged.    She was explained that her typical spell described by her husband was not captured during this admission and that she will need to come in for another EMU admission if deemed necessary by Dr. .    Patient was advised to continue Keppra 1 g BID and follow-up with Dr. Elesa Massed in 2 months from discharge.     TRANSITION/POST DISCHARGE CARE/PENDING TESTS/REFERRALS:   F/u with Dr. Elesa Massed in 2 months  Continue Keppra 1 g BID    CONDITION ON DISCHARGE:  A. Ambulation: Full ambulation  B. Self-care Ability: Complete  C. Cognitive Status Alert and Oriented x 3  D. Code status at discharge:        LINES/DRAINS/WOUNDS AT DISCHARGE:   Patient Lines/Drains/Airways  Status     Active Line /  Dialysis Catheter / Dialysis Graft / Drain / Airway / Wound     Name Placement date Placement time Site Days    Peripheral IV Left Basilic  (medial side of arm) 09/24/21  1744  -- 3                DISCHARGE DISPOSITION:  Home discharge      NEUROLOGY RISK FACTORS:  -None of the following conditions apply        DISCHARGE INSTRUCTIONS:   Follow-up Information     Neurology, Physician Office Center .    Specialty: Neurology  Contact information:  [facility] [address]  [place] [address]  [phone]  Additional information:  Your Health is our Highest Priority* Valet Services are currently suspended due to COVID-19 restrictions at this [facility] Medicine outpatient clinic. We apologize for any inconvenience this may cause. Please ask an attendant for assistance if needed.                        DISCHARGE INSTRUCTION - IMPORTANT INFORMATION    GENERAL INFORMATION: Contact the neurology office for questions/concerns Monday-Friday 7 a.m. - 5 p.m. at [phone].     FOLLOW-UP: NEUROLOGY - PHYSICIAN OFFICE CTR - [place], [place]     Follow-up in: OTHER    Other, Please Specify: 2 months    Reason for visit: HOSPITAL DISCHARGE    Follow-up reason: Follow-up after hospital EMU discharge    Provider: Dr. Elesa Massed           Junious Silk, MD  PGY-3 Neurology  Pager [phone]      Copies sent to Care Team       Relationship Specialty Notifications Start End    Aleene Davidson, [place] PCP - General PHYSICIAN ASSISTANT  03/27/21     Phone: [phone] Fax: [phone]         [address] [place] [address]            Referring providers can utilize [email or web address] to access their referred [facility] Medicine patient's information.                I saw and examined the patient.  I reviewed the resident's note.  I agree with the findings and plan of care as documented in the resident's note.  Any exceptions/additions are edited/noted.    Melina Modena, MD

## 2021-09-28 NOTE — Care Management Notes (Addendum)
MSW received call from RN reporting pt ready for d/c and needing Motivecare. MSW spoke with [facility] at [facility] [phone]) and arranged transportation. #[phone]. Will call unit clerk when ride is arranged. May have difficulty due to distance of trip.     Daine Gravel, SOCIAL WORKER  09/28/2021, 13:42

## 2021-09-28 NOTE — Nurses Notes (Signed)
Pt awaiting ride time.

## 2021-09-28 NOTE — Nurses Notes (Signed)
Spoke to [employer] about ride. Person on phone stated they are still reaching out to several individuals to take this job. Last one was at 1512 and they have not heard back yet from this one. He stated they should find someone to transport her today. He stated I could call back anytime to check on progress. I said I'd call back in about an hour to see if any new information was available.

## 2021-09-28 NOTE — Care Plan (Signed)
Pt continues on VEEG, side rails padded for pt safety. No events noted or reported.   Problem: Seizure, Active Management  Goal: Absence of Seizure/Seizure-Related Injury  Outcome: Ongoing (see interventions/notes)

## 2021-09-28 NOTE — Progress Notes (Signed)
[facility]  Neurology Progress Note      Kaitlin Adams, Kaitlin Adams, 60 y.o. female  Date of Admission:  09/24/2021  Date of service: 09/28/2021  Date of Birth:  08/15/1961      Chief Complaint: Spell Capture    Pt's condition today: stable    Subjective:   Few spells captured overnight.    Vital Signs:  Temp (24hrs) Max:36.7 C (98.1 F)      Systolic (24hrs), Avg:126 , Min:123 , Max:131     Diastolic (24hrs), Avg:81, Min:76, Max:84    Temp  Avg: 36.4 C (97.5 F)  Min: 35.8 C (96.4 F)  Max: 36.7 C (98.1 F)  Pulse  Avg: 74.7  Min: 74  Max: 76  Resp  Avg: 16  Min: 14  Max: 17  SpO2  Avg: 100 %  Min: 100 %  Max: 100 %       Today's Physical Exam:  General: alert  Mental status: Alert and oriented x 3  Memory: Registration, Recall, and Following of commands is normal  Attention: Attention and Concentration are normal  Knowledge: Good  Language and Speech: Normal and Normal  Cranial nerves: Cranial nerves 2-12 are normal  Muscle tone: WNL  Motor strength:  Motor strength is normal throughout.  Sensory: Sensory exam in the upper and lower extremities is normal  Gait: Normal  Coordination: Coordination is normal without tremor  Reflexes: Reflexes are 2/2 throughout      Current Medications:  acetaminophen (TYLENOL) tablet, 650 mg, Oral, Q4H PRN  amitriptyline (ELAVIL) tablet, 10 mg, Oral, NIGHTLY  ARIPiprazole (ABILIFY) tablet, 2 mg, Oral, Daily  diphenhydrAMINE (BENADRYL) capsule, 25 mg, Oral, Q4H PRN  enoxaparin PF (LOVENOX) 40 mg/0.4 mL SubQ injection, 40 mg, Subcutaneous, Daily  LORazepam (ATIVAN) 2 mg/mL injection, 2 mg, Intravenous, Once PRN  LORazepam (ATIVAN) 2 mg/mL injection, 2 mg, Intravenous, Once PRN  LORazepam (ATIVAN) 2 mg/mL injection, 4 mg, Intravenous, Q5 Min PRN  melatonin tablet, 3 mg, Oral, HS PRN  mirtazapine (REMERON) tablet, 30 mg, Oral, NIGHTLY  PARoxetine (PAXIL) tablet, 20 mg, Oral, Daily        I/O:  I/O last 24 hours:      Intake/Output Summary (Last 24 hours) at 09/28/2021 2992  Last data  filed at 09/28/2021 0000  Gross per 24 hour   Intake 225 ml   Output 2 ml   Net 223 ml     I/O current shift:  No intake/output data recorded.    Labs  Please indicate ordered or reviewed)  No recent labs     Review of reports and notes reveal:   No recent reports     Independent Interpretation of images or specimens:      Patient/ Family Discussion: Dicussed with patient the plan of care, she is agreeable.     Assessment/Plan:  Active Hospital Problems    Diagnosis   . Primary Problem: Seizure-like activity (CMS HCC)     Assessment/Plan:    Kaitlin Adams is a 60 y.o. female who presents for elective EMU admission to evaluate for event characterization.  Follows with Dr. Elesa Massed in In Neurology clinic.    -Admit to EMU for event characterization.  -Ordered extended vEEG  -Neuro checks and vitals q4h when awake  -Regular diet   -Home AED regimen: Keppra 1 g BID   -Continue home medications   -Anticipated length of stay: 3-5 days  Plan for clinical seizure:  1. If patient has 4 or more focal seizures  within 30 min to 1 hour in a cluster, give ativan 2 mg one time. If patient continues to cluster 4 focal seizures, can give one more dose ativan 2 mg  2. Give ativan 2 mg for any GTC seizure lasting 5 minutes or greater    Date Day of Stay AED plan   1 09/24/21 . Wean Keppra to 500 mg and Lamictal to 25 mg tonight   2 09/25/21 . Off Keppra today, monitor for clinical event    3 09/26/21 . Photic yesterday, no spells captured, will continue to monitor    4 09/27/21 . One spell captured overnight with no EEG correlate, off ASMs   5 09/28/21 . Spells captured yesterday, off ASMs       DNR Status this admission:  Full Code  Palliative/Supportive Care consulted?  no  Hospice Consulted?  Not applicable    Current Comorbid Conditions  Compression of the Brain:  No  Obstructive Hydrocephalus:  no  Coma (GCS less than 8): Coma -Not applicable  TIA not applicable  Encephalopathy:  no  Encephalitis-Not  applicable  Seizure-Unspecified    Respiratory Failure/Other-Not applicable  No  Coagulopathy Not applicable  N/A    DVT/PE Prophylaxis: Enoxaparin      Junious Silk, MD  PGY-3 Neurology  Pager [phone]    I saw and examined the patient.  I reviewed the resident's note.  I agree with the findings and plan of care as documented in the resident's note.  Any exceptions/additions are edited/noted.    Melina Modena, MD

## 2021-09-28 NOTE — Nurses Notes (Signed)
Discharge instructions given to pt. Pt verbalized understanding. Time for questions given No questions asked. Pt left via wheelchair and transport staff for lobby.

## 2021-09-29 ENCOUNTER — Ambulatory Visit (INDEPENDENT_AMBULATORY_CARE_PROVIDER_SITE_OTHER): Payer: Self-pay | Admitting: Neurology

## 2021-09-29 ENCOUNTER — Ambulatory Visit (INDEPENDENT_AMBULATORY_CARE_PROVIDER_SITE_OTHER): Payer: Self-pay

## 2021-09-29 ENCOUNTER — Other Ambulatory Visit: Payer: Self-pay

## 2021-09-29 NOTE — Telephone Encounter (Addendum)
EKG complete 09/25/21 - read as normal. QTc okay. This was last remaining step before starting Mayzent. Will notify pharmacy clear to start.   Maylon Cos, [place]  09/29/2021, 13:32      Regarding: Ward  ----- Message from Lorenda Hatchet sent at 09/29/2021  1:21 PM EDT -----  Ward - Rx Refill     MAYZENT STARTER,FOR 2MG  MAINT, 0.25 mg (12 tabs) Oral Tablets, Dose Pack    MAYZENT 2 mg Oral Tablet    Preferred Pharmacy     [facility] [address] - [address]    [address] [place]    Phone: [phone] Fax: [phone]    Hours: Not open 24 hours        Thanks

## 2021-09-29 NOTE — Telephone Encounter (Addendum)
Attempted to call pt/spouse. LMOM requesting call back for more information re sz activity/behavior.    Also advised they could send update via MyChart message.    Ane Payment, RN  09/29/2021, 11:41  ----------------------------------------------  ----- Message from Ane Payment, RN sent at 09/26/2021  3:08 PM EDT -----  Regarding: FW: ward  ----- Message from Sherril Cong sent at 09/25/2021  9:51 AM EDT -----  Patient husband was told to call with update on the patient's seizure behavior, please return call.

## 2021-09-30 ENCOUNTER — Other Ambulatory Visit: Payer: Self-pay

## 2021-09-30 ENCOUNTER — Telehealth (INDEPENDENT_AMBULATORY_CARE_PROVIDER_SITE_OTHER): Payer: Self-pay

## 2021-09-30 NOTE — Telephone Encounter (Signed)
Contacted [place] [employer] to request Mayzent loading doses be re-authorized. Pharmacist adjusted loading dose approval date.  Maylon Cos, PHARMD  09/30/2021, 10:52

## 2021-10-03 ENCOUNTER — Other Ambulatory Visit: Payer: Self-pay

## 2021-10-03 ENCOUNTER — Ambulatory Visit (INDEPENDENT_AMBULATORY_CARE_PROVIDER_SITE_OTHER): Payer: Self-pay | Admitting: Neurology

## 2021-10-03 ENCOUNTER — Encounter (INDEPENDENT_AMBULATORY_CARE_PROVIDER_SITE_OTHER): Payer: Self-pay

## 2021-10-03 NOTE — Telephone Encounter (Addendum)
Fwd this request to Pharm D.    Thank you,  Ane Payment, RN  10/03/2021, 13:07    ----------------------------------------------  Regarding: ward  ----- Message from Sherril Cong sent at 10/03/2021  9:35 AM EDT -----  Patient needs more information on starting the MAYZENT.

## 2021-10-06 ENCOUNTER — Telehealth (INDEPENDENT_AMBULATORY_CARE_PROVIDER_SITE_OTHER): Payer: Self-pay | Admitting: PHARMACY

## 2021-10-06 ENCOUNTER — Other Ambulatory Visit: Payer: Self-pay

## 2021-10-06 ENCOUNTER — Ambulatory Visit (INDEPENDENT_AMBULATORY_CARE_PROVIDER_SITE_OTHER): Payer: Self-pay | Admitting: Neurology

## 2021-10-06 NOTE — Telephone Encounter (Addendum)
[facility] Medicine Neurology  Clinical Pharmacist Note    Contacted Mrs. Stanczyk regarding new start Mayzent - patient is to receive medication on Thursday.    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 EKG completed 09/25/21.    Counseled on Mayzent MOA, dosage/administration, side effects, precautions, monitoring, and insurance authorization process. Medications reviewed and updated with patient.     Counseling:   - Dose is gradually increased over the first 5 days to the maintenance dose of 2 mg once daily, with or without food. This is done to mitigate the risk of bradycardia. Sent patient dosing directions over MyChart  - If >4 doses are missed while on maintenance therapy, must restart with the titration pack on day 1.   - The most common side effects seen with Mayzent include headache, nausea, decrease in heart rate (lesser risk than Gilenya so no FDO), and increase in blood pressure.   - Rare but serious side effects include infections, including PML, macular edema, conduction abnormalities, and hepatic injury. Labs, eye exam and EKG must be complete before starting treatment and will be monitored during treatment.  - Avoid live vaccines, such as intranasal flu and immunizations required for travel to other countries, while taking Mayzent.      Alain Marion, PharmD, TTS  PGY2 Ambulatory Care Pharmacy Resident  [facility] Medicine Neurology   [phone], ext [phone]    I reviewed the resident's note.  I agree with the findings and plan of care as documented in the resident's note.  Any exceptions/additions are edited/noted.  Patient has complete baseline OCT with Gilenya.  Will ensure a follow up eye exam is complete 3-4 months after treatment is initiated.   Baseline EKG okay on current meds, including QT prolonger.  Will monitor  as needed.  Maylon Cos, [place] 10/07/2021, 13:47

## 2021-10-07 ENCOUNTER — Other Ambulatory Visit (INDEPENDENT_AMBULATORY_CARE_PROVIDER_SITE_OTHER): Payer: Self-pay | Admitting: Pharmacist

## 2021-10-07 ENCOUNTER — Other Ambulatory Visit: Payer: Self-pay

## 2021-10-07 NOTE — Telephone Encounter (Signed)
Specialty Pharmacy Initial Assessment Note    Date of assessment: 10/03/2021  Patient: Kaitlin Adams is a 60 y.o. 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 She was previously on Gilenya but had to change medication due to insurance formulary. She reports the following signs/symptoms of multiple sclerosis per her last neurology visit: muscle cramps and burning sensation in legs. Fingers are numb. Unsteady on her feet. Also has seizures, unrelated to multiple sclerosis.  Reports overall quality of life as fair. Denies missed days from work or planned activities due to disease. Patient recently hospitalized for seizure in the past month. Patient reports a goal of prevent relapse with Mayzent therapy.     Patient reports that she has vision issues. 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 Her ophthalmologist believes the cause is retinal in nature. She says her last visit to the eye doctor was a year and a half ago.    Obtained updated medication list with noted discrepancies: none.     Objective:   Pertinent labs include:   CYP2C9 *1/*1 (NM) - 03/27/21  Quant TB neg 03/27/21  Hep B neg 03/27/21  VZV Ab pos 03/27/21  MRI 01/2021  EKG normal 09/25/21    Assessment:  Reviewed medications, conditions, and allergies with no known contraindications to therapy.  No significant drug-drug interactions expected.  Medication therapy is considered appropriate with a therapeutic goal of: prevent relapse/disease progression.     Plan:  Provided education regarding medication purpose, dosing, storage, administration, methods for adherence, and how to manage potential missed doses. Discussed potential side effects such as increase in blood pressure, elevated liver enzymes, headache, and  decreased heart rate and monitoring including when to contact pharmacy, inform provider, and seek appropriate care.  Medication to be delivered 11/10. Patient plans to take medication as prescribed and use pill box as a method of adherence.     Scheduled pharmacist follow-up for next refill.  Patient had no additional questions and was encouraged to contact [facility] with any questions or concerns.     13/10, PHARMD  10/07/2021, 16:02

## 2021-10-30 ENCOUNTER — Ambulatory Visit: Payer: Medicaid Other | Attending: Family | Admitting: Family

## 2021-10-30 ENCOUNTER — Ambulatory Visit (INDEPENDENT_AMBULATORY_CARE_PROVIDER_SITE_OTHER): Payer: Self-pay | Admitting: Neurology

## 2021-10-30 DIAGNOSIS — R569 Unspecified convulsions: Secondary | ICD-10-CM | POA: Insufficient documentation

## 2021-10-30 DIAGNOSIS — G35 Multiple sclerosis: Secondary | ICD-10-CM | POA: Insufficient documentation

## 2021-10-30 DIAGNOSIS — F419 Anxiety disorder, unspecified: Secondary | ICD-10-CM | POA: Insufficient documentation

## 2021-10-30 NOTE — Telephone Encounter (Addendum)
Had virtual visit with NP today to discuss.    ----- Message from Clovis Pu, RN sent at 10/29/2021  8:06 AM EST -----  Regarding: FW: Austan Nicholl  ----- Message from Kathie Dike sent at 10/28/2021  1:49 PM EST -----  Lyla Son is experiencing numbness in bpth hands up to the wrists, arms and across the chest at times. She is concerned and is wanting to consult with Dr. Elesa Massed on this.      [facility] is able to be reached at [phone], or [place] at [phone]

## 2021-10-30 NOTE — Progress Notes (Signed)
[facility]  Multiple Sclerosis and Clinical Neuroimmunology Clinic    Date:  10/30/2021  Name: Ellise Suchan  Age:  60 y.o.  Referring Physician:   No referring provider defined for this encounter.    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  Steroid history:  None    Neurologic history:  -2015: Numbness in distal lower extremities, progressed to ankles and now to knees. Got MRI and LP. Started on Gilenya.   -Diagnosed with seizures in summer 2020 - arms contract, knees pull to her chest, shakes, jaw clenches, confused afterwards.    Pertinent studies:  -Reviewed MRI report from [facility] from 01/2021 was read as stable white matter disease compatible with multiple sclerosis compared to 08/2019. Images not available.  -Prior records from [facility] indicate that she had C/T spine lesions on prior MRI and had 10 OCBs in her CSF.    ------------------------    Dream Hodnett is a 60 y.o. female who presents with a chief concern of:      Today she is concerned bc her ativan has ran out. This is the only thing that helps her symptoms.   States she has been experiencing all adverse side effects since starting mayzent.   Started it 10/28 Nausea after taking pill. Headache. Vision changes. Bruising. Tongue numbness- wants to try to push through side effects to tolerate it  kaleidoscope vision started yesterday morning. Ativan only helps   Husband states she had a panic attack last night   In the past she had vision changes in past- will occasionally turn into a migraine.   Ativan 4mg  total per day. Dr. Joslyn Devon (psychiatry) prescribes this but she is out because prescription is only wrote for 3mg .   anixety makes "seizure worse"  No seizures. Continues  Keppra 1000 mg bid    Past Medical History:   Diagnosis  Date   . Depression    . Migraine    . Multiple sclerosis (CMS HCC)    . Seizure (CMS [place])    "Spot" on her lung.    Past Surgical History:   Procedure Laterality Date   . HX APPENDECTOMY     . HX CESAREAN SECTION     . HX TONSILLECTOMY         Current Outpatient Medications   Medication Sig   . amitriptyline (ELAVIL) 10 mg Oral Tablet Take 1 Tablet (10 mg total) by mouth Every night   . ARIPiprazole (ABILIFY) 2 mg Oral Tablet Take 2 mg by mouth Once a day   . cetirizine (ZYRTEC) 10 mg Oral Tablet Take 1 Tablet by mouth Once a day   . cholecalciferol, Vitamin D3, 75 mcg (3,000 unit) Oral Tablet Take 3,000 Units by mouth Every Monday, Wednesday and Friday   . ELDERBERRY FRUIT ORAL Take by mouth   . hydrOXYzine pamoate (VISTARIL) 50 mg Oral Capsule TAKE 1 CAPSULE BY MOUTH THREE TIMES DAILY AS NEEDED   . levETIRAcetam (KEPPRA) 500 mg Oral Tablet Take 2 Tablets (1,000 mg total) by mouth Twice daily   . LORazepam (ATIVAN) 1 mg Oral Tablet Take 1 Tablet (1 mg total) by mouth Three times a day   . MAYZENT 2 mg Oral Tablet Take 1 Tablet (2 mg total) by mouth Once a day   .  MAYZENT STARTER,FOR 2MG  MAINT, 0.25 mg (12 tabs) Oral Tablets, Dose Pack Take one tablet by mouth on days 1 and 2, two tablets on day 3, three tablets on day 4, and five tablets on day 5   . mirtazapine (REMERON) 30 mg Oral Tablet Take 1 Tablet (30 mg total) by mouth Every night   . PARoxetine (PAXIL) 20 mg Oral Tablet Take 20 mg by mouth Once a day   . zinc 50 mg Oral Tablet Take 30 mg by mouth Once a day   . zolpidem (AMBIEN) 10 mg Oral Tablet Take 10 mg by mouth Every night as needed   Depakote was 250 mg BID.  Dr is her psychiatrist at [facility].    Allergies   Allergen Reactions   . Latex    . Ceclor [Cefaclor]    . Penicillins    . Sulfa (Sulfonamides)      Family Medical History:     Problem Relation (Age of Onset)    Multiple Sclerosis Other        Social History     Socioeconomic History   . Marital status: Married   Tobacco Use   .  Smoking status: Never   . Smokeless tobacco: Never   Substance and Sexual Activity   . Alcohol use: Yes   . Drug use: Never   No smoking, minimal EtOH use.    No exam due to telemed visit     Assessment and Plan:  1. Multiple sclerosis (CMS HCC)    2. Seizure (CMS HCC)         No orders of the defined types were placed in this encounter.      60 y.o. female with multiple sclerosis     - complaints of worsening symptoms in setting of anxiety  - requesting ativan dose change  - encouraged her to see psychiatrist who prescribes med  - she should go to ED for any withdraw symptoms due to not having ativan since Tuesday  - will follow up with me as needed  - has appointment in March with Dr. April, APRN,FNP-BC  10/30/2021, 13:39    TELEMEDICINE DOCUMENTATION:    Patient Location:  MyChart video visit from home address: [address]  [place]  14/11/2020 [place]    Patient/family aware of provider location:  yes  Patient/family consent for telemedicine:  yes  Examination observed and performed by:  [id number], APRN,FNP-BC

## 2021-11-03 ENCOUNTER — Other Ambulatory Visit: Payer: Self-pay

## 2021-11-04 ENCOUNTER — Encounter (INDEPENDENT_AMBULATORY_CARE_PROVIDER_SITE_OTHER): Payer: Self-pay | Admitting: Neurology

## 2021-11-05 ENCOUNTER — Other Ambulatory Visit (INDEPENDENT_AMBULATORY_CARE_PROVIDER_SITE_OTHER): Payer: Self-pay | Admitting: Pharmacist

## 2021-11-05 ENCOUNTER — Other Ambulatory Visit: Payer: Self-pay

## 2021-11-05 NOTE — Telephone Encounter (Signed)
Specialty Pharmacy Follow-up Assessment Note    Date of assessment: 11/05/2021  Patient: Kaitlin Adams is a 60 y.o. female  Diagnosis: RRMS  Specialty Medication(s): Mayzent 2 mg by mouth daily  Prescriber: Rosalyn Gess, MD  Initiation of therapy: 10/09/2021    Subjective:  Spoke with patient for initial follow-up regarding Mayzent. Continues to take daily; reports 0 missed doses and using routine as a method of adherence. Regarding adherence patient has about 6 tablets left on hand.  Reports the following side effects: nausea, diarrhea, vomiting, weight gain, and bad dreams. She says she's gained ~15 lbs in the last 2 weeks. All symptoms started after Mayzent use.    She reports the following signs/symptoms of RRMS: still having numbness in left lower extremities but it is contained in the foot. Transient numbness in fingers. She is unsure of how overall quality of life/symptoms has changd since last assessment.  Patient continues to work towards current goal of prevent relapse.  Denies missed days from work or planned activities due to disease. Denies recent hospitalizations/ER/urgent care visits in the past month related to diagnosis.      Of note, patient says it is a bit more difficult to see since starting Mayzent but is not completely blind.     Obtained updated medication list with note of the following discrepancies: Patient reports taking LInzess 145 mcg daily    Objective:  Pertinent labs include:   CYP2C9 *1/*1 (NM) - 03/27/21  Quant TB neg 03/27/21  Hep B neg 03/27/21  VZV Ab pos 03/27/21  MRI 01/2021  EKG normal 10/27/2    Assessment:  Reviewed medications, conditions, and allergies with no known contraindications for continuation in therapy.  No significant drug-drug interactions expected.  Reported medication on hand matches fill records and demonstrates adherence.     Patient is working towards therapeutic goal of: prevent relapse/disease progression.     Plan:   Continue therapy: therapy appropriate.   Patient declined review of administration process, storage, and missed dose handling today. Discussed side effects, let her know that weight gain is not associated with Mayzent. She will discuss with provider at next appointment on 12/30. Advise she also see an eye doctor for checkup or sooner if symptoms get worse.    Scheduled delivery of Mayzent for 12/13. Scheduled pharmacist follow-up for 6 months. Patient expressed no additional questions and was encouraged to contact [facility] with any questions or concerns.     Greer Pickerel, PHARMD  11/05/2021, 13:17

## 2021-11-11 ENCOUNTER — Other Ambulatory Visit: Payer: Self-pay

## 2021-11-28 ENCOUNTER — Ambulatory Visit (INDEPENDENT_AMBULATORY_CARE_PROVIDER_SITE_OTHER): Payer: Self-pay | Admitting: Neurology

## 2021-12-02 ENCOUNTER — Other Ambulatory Visit: Payer: Self-pay

## 2021-12-03 ENCOUNTER — Other Ambulatory Visit: Payer: Self-pay

## 2021-12-11 ENCOUNTER — Other Ambulatory Visit: Payer: Self-pay

## 2022-01-01 ENCOUNTER — Other Ambulatory Visit: Payer: Self-pay

## 2022-01-01 ENCOUNTER — Encounter (INDEPENDENT_AMBULATORY_CARE_PROVIDER_SITE_OTHER): Payer: Self-pay | Admitting: Neurology

## 2022-01-02 ENCOUNTER — Other Ambulatory Visit (INDEPENDENT_AMBULATORY_CARE_PROVIDER_SITE_OTHER): Payer: Self-pay | Admitting: Pharmacist

## 2022-01-02 ENCOUNTER — Other Ambulatory Visit: Payer: Self-pay

## 2022-01-02 ENCOUNTER — Other Ambulatory Visit (INDEPENDENT_AMBULATORY_CARE_PROVIDER_SITE_OTHER): Payer: Self-pay | Admitting: Neurology

## 2022-01-02 DIAGNOSIS — T148XXA Other injury of unspecified body region, initial encounter: Secondary | ICD-10-CM

## 2022-01-02 NOTE — Telephone Encounter (Signed)
Specialty Pharmacy Clinical Intervention    Patient called in to [facility] yesterdayn2/2 with concerns for bruising. She questioned if it could be a side effect from Mayzent. She says the bruises started showing up after she started Mayzent in November 2022. She reports that she bruises easily, and that bruises are a dark purple. The marks do not go away, and she said they are all over her arms and legs. She states she has not yet discussed symptoms with Neutology.    Advised that bruising is not listed as a side effect of Mayzent. Advised I would inform Neurology for them to follow up. Most recent labs in Epic from 03/25/21, despite being admitted to [facility] in October. Encouraged patient to contact [facility] with anymore questions or concerns, she voiced understanding.    Greer Pickerel, PHARMD  01/02/2022, 13:45

## 2022-01-06 ENCOUNTER — Telehealth (INDEPENDENT_AMBULATORY_CARE_PROVIDER_SITE_OTHER): Payer: Self-pay

## 2022-01-06 NOTE — Telephone Encounter (Signed)
Returned missed call from Mrs. Woodfin husband.  He reports Mrs. Lemmerman has been experiencing numb legs, bruises up and down both arms looks like she's being beaten. No seizure episodes but having changes in vision. They received a message about getting some blood work but would prefer to have this done locally instead of traveling so far. This can be complete through [facility].  They should have access to the orders, but if they do not for some reason recommended they contact my number or the main line to have the orders faxed.     Encouraged to contact office with any questions or concerns. He verbalized agreement and understanding to all information.   Maylon Cos, [facility]  01/06/2022, 16:37    After ending call, realized the patient likely has not complete the follow up OCT for Mayzent. Attempted to call to follow up on this, Left VM with extension to return call. Sent MyChart message inquiring when she last complete an eye exam.   Maylon Cos, PHARMD  01/06/2022, 16:42

## 2022-01-07 ENCOUNTER — Other Ambulatory Visit (INDEPENDENT_AMBULATORY_CARE_PROVIDER_SITE_OTHER): Payer: Self-pay | Admitting: Neurology

## 2022-01-07 ENCOUNTER — Encounter (INDEPENDENT_AMBULATORY_CARE_PROVIDER_SITE_OTHER): Payer: Self-pay | Admitting: Neurology

## 2022-01-07 DIAGNOSIS — H539 Unspecified visual disturbance: Secondary | ICD-10-CM

## 2022-01-07 NOTE — Telephone Encounter (Signed)
Called Kaitlin Adams to request Fax number [phone]. Receptionist reports Kaitlin Adams last appointment was in November 2020. Currently booked through May, however able to work in Monday @ 8:15 am.    Contacted Kaitlin Adams to discuss.   She reports noticing changes about 2 months ago. Described as blurriness jumping back and forth quickly. Impacts majority of the vision field in both eyes. Wearing glasses makes worse, needs glasses to read. No change in color. Discussed that Mayzent can rarely cause change in vision, normally within the time frame she is in. She is agreeable to appointment on Monday, 2/13 @ 8:15 am with optometry.   Encouraged to contact office with any questions or concerns. She verbalized agreement and understanding to all information.       Faxed referral to [employer], PHARMD  01/07/2022, 17:10

## 2022-01-21 ENCOUNTER — Other Ambulatory Visit: Payer: Self-pay

## 2022-01-26 ENCOUNTER — Other Ambulatory Visit: Payer: Self-pay

## 2022-01-28 ENCOUNTER — Encounter (INDEPENDENT_AMBULATORY_CARE_PROVIDER_SITE_OTHER): Payer: Self-pay | Admitting: Neurology

## 2022-01-29 ENCOUNTER — Other Ambulatory Visit: Payer: Self-pay

## 2022-01-30 ENCOUNTER — Other Ambulatory Visit: Payer: Self-pay

## 2022-02-02 ENCOUNTER — Other Ambulatory Visit: Payer: Self-pay

## 2022-02-03 ENCOUNTER — Other Ambulatory Visit: Payer: Self-pay

## 2022-02-06 ENCOUNTER — Other Ambulatory Visit: Payer: Self-pay

## 2022-02-09 ENCOUNTER — Other Ambulatory Visit: Payer: Self-pay

## 2022-02-26 ENCOUNTER — Other Ambulatory Visit: Payer: Self-pay

## 2022-03-02 ENCOUNTER — Other Ambulatory Visit: Payer: Self-pay

## 2022-03-05 ENCOUNTER — Other Ambulatory Visit: Payer: Self-pay

## 2022-03-10 ENCOUNTER — Other Ambulatory Visit: Payer: Self-pay

## 2022-03-11 ENCOUNTER — Other Ambulatory Visit: Payer: Self-pay

## 2022-03-12 ENCOUNTER — Other Ambulatory Visit: Payer: Self-pay

## 2022-03-16 ENCOUNTER — Other Ambulatory Visit: Payer: Self-pay | Admitting: Pharmacist

## 2022-03-16 ENCOUNTER — Other Ambulatory Visit: Payer: Self-pay

## 2022-03-16 NOTE — Telephone Encounter (Signed)
Specialty Pharmacy Note    Date of call: 03/16/2022  Patient: Kaitlin Adams is a 61 y.o. female  Specialty Medication(s): Mayzent     S:  Patient states she has missed 5 doses of Mayzent.   Last filled 30 DS - delivered 02/11/22.     Cramps in legs, legs feeling numb today.     Bruises still present on arms.     She has headaches, hasn't taken anything for this.   Migraines occurring more frequently. Has pain in cheeks, temples, and teeth.    Experiencing a lot of stress and depression lately.    Has been having MS hug/pressure in chest.    O:   N/A    A/P:   Will [facility] clinic for rec on re titrating dose/review of subjective report.     Everlene Balls, PHARMD, BCPS  03/16/2022, 09:16

## 2022-03-17 ENCOUNTER — Other Ambulatory Visit: Payer: Self-pay

## 2022-03-18 ENCOUNTER — Other Ambulatory Visit: Payer: Self-pay

## 2022-03-18 ENCOUNTER — Other Ambulatory Visit: Payer: Self-pay | Admitting: Pharmacist

## 2022-03-18 NOTE — Telephone Encounter (Signed)
Patient called in with questions regarding re-titration of Mayzent. She has now missed 10 consecutive doses, and will have to restart titration. Informed patient that she has labs that clinic would like her to receive, and should now likely be completed prior to re-starting titration.     Patient asked that lab orders be sent to [facility] in [place], [place] (Phone number: [phone]; Fax number: [phone]) to be completed. She would like a phone call when labs are faxed so she knows to get them done.     Elwanda Brooklyn, PHARMD  03/18/2022, 10:49

## 2022-03-19 ENCOUNTER — Other Ambulatory Visit: Payer: Self-pay

## 2022-03-19 NOTE — Telephone Encounter (Signed)
Faxed to [facility].

## 2022-03-24 ENCOUNTER — Other Ambulatory Visit: Payer: Self-pay

## 2022-03-24 ENCOUNTER — Telehealth (INDEPENDENT_AMBULATORY_CARE_PROVIDER_SITE_OTHER): Payer: Self-pay

## 2022-03-24 NOTE — Telephone Encounter (Signed)
Attempted to contact Kaitlin Adams to follow up on Mayzent. Patient reports missing >10 doses of Mayzent, thus will need to restart titration -d/w pharmacy. Also needs to get updated blood work. This has been faxed to [facility] per request. sent MyChart message updating.  Maylon Cos, [place]  03/24/2022, 13:52

## 2022-03-27 ENCOUNTER — Other Ambulatory Visit: Payer: Self-pay

## 2022-03-30 ENCOUNTER — Other Ambulatory Visit: Payer: Self-pay

## 2022-03-31 ENCOUNTER — Other Ambulatory Visit: Payer: Self-pay

## 2022-03-31 ENCOUNTER — Telehealth (INDEPENDENT_AMBULATORY_CARE_PROVIDER_SITE_OTHER): Payer: Self-pay

## 2022-03-31 ENCOUNTER — Other Ambulatory Visit (INDEPENDENT_AMBULATORY_CARE_PROVIDER_SITE_OTHER): Payer: Self-pay

## 2022-03-31 MED ORDER — MAYZENT STARTER PACK (FOR 2 MG MAINT DOSE) 0.25 MG (12 TABS) TABLETS
ORAL_TABLET | ORAL | 0 refills | Status: DC
Start: 2022-03-31 — End: 2022-06-08
  Filled 2022-03-31: qty 12, 5d supply, fill #0

## 2022-03-31 MED ORDER — MAYZENT 2 MG TABLET
2.0000 mg | ORAL_TABLET | Freq: Every day | ORAL | 11 refills | Status: DC
Start: 2022-03-31 — End: 2023-04-02
  Filled 2022-03-31 (×2): qty 30, 30d supply, fill #0
  Filled 2022-04-28: qty 30, 30d supply, fill #1
  Filled 2022-06-01: qty 30, 30d supply, fill #2
  Filled 2022-07-01: qty 30, 30d supply, fill #3
  Filled 2022-08-04: qty 30, 30d supply, fill #4
  Filled 2022-09-03: qty 30, 30d supply, fill #5
  Filled 2022-10-05: qty 30, 30d supply, fill #6
  Filled 2022-11-02 – 2022-11-04 (×3): qty 30, 30d supply, fill #7
  Filled 2022-11-13 – 2022-12-01 (×2): qty 30, 30d supply, fill #0
  Filled 2022-12-31: qty 30, 30d supply, fill #1
  Filled 2023-02-02: qty 30, 30d supply, fill #2
  Filled 2023-03-02: qty 30, 30d supply, fill #3

## 2022-03-31 NOTE — Telephone Encounter (Signed)
Specialty Pharmacy Note    Prior Authorization for medication Mayzent Starter Pack has been approved by payor RDT until 04/04/22.  Approval notice has been scanned into Epic and can be found in the Media tab.    If you have any questions, don't hesitate to contact the Specialty Pharmacy Jonah Blue, Pharmacy Technician  03/31/2022, 14:23

## 2022-03-31 NOTE — Telephone Encounter (Signed)
Mrs. Court contacted office to follow up on Mayzent issue. She has been without Mayzent for over a month now.  Of note, patient had poor connection making hard to discern some statements.     Concerned because having extreme leg weakness that is continually getting worse. Hard to get lets working, legs are on fire, takes significant effort to even stand up. She reports this is not something she has ever experienced before (per notes it appears she has had some gait instability in past however she insists this is not something she's experienced before).    Also experiencing headache in temple/cheek bone area. Left side (neck?) feels like theres a knot that goes away and returns. Feels heartbeat in the knot in neck "artery" which is causing significant anxiety. Experiencing this pain and knot daily, multiple times/day. It comes and goes.  Bruising is still happening as well.     Discussed in person follow up likely needed. She reports she can not get up here right now. Husband in nursing home and mother just broke hip, Unable to drive up here due to seizure, thus no one available for transportation.    She asked if she's going to be continuing Mayzent or switching medications. Advised refill/ starter pack was sent to the pharmacy today. She asked about labs, which I advised appeared normal overall but will defer to Dr. Elesa Massed for interpretation. Reports she does not feel like self at all.     Also asked if weight gain is normal with Mayzent. *this portion connection was particularly poor* Gained 23 lbs while on (vs since stopped?). Weighs more than ever has before?    Will discuss with Dr. Elesa Massed and follow up with patient.  Maylon Cos, PHARMD  03/31/2022, 15:03

## 2022-03-31 NOTE — Telephone Encounter (Signed)
Contacted [facility] to request lab results. Representative advised last blood work done for this patient was in March 2022. She also pulled the entire sign in sheet for the  [facility] from last week - was able to find where she checked in on April 27. Her account has a different last name listed. Results faxed, will upload once received.   Will pend Rx for starter dose and MD.   Maylon Cos, PHARMD  03/31/2022, 10:34         Ward - rx refill - PT IS COMPLETELY OUT OF MEDICATION - Pt had blood work done at [facility] in [place] last week and they were supposed to fax it to you.  She said she has been without medication for almost a month and she can hardly walk now.     MAYZENT 2 mg Oral Tablet     Preferred Pharmacy    [facility]   [address] [place] [address]   Phone: [phone] Fax: [phone]   Hours: Monday-Friday 8AM-6PM, Saturday & Sunday Closed

## 2022-04-01 ENCOUNTER — Other Ambulatory Visit: Payer: Self-pay | Admitting: Pharmacist

## 2022-04-01 ENCOUNTER — Other Ambulatory Visit: Payer: Self-pay

## 2022-04-02 ENCOUNTER — Other Ambulatory Visit: Payer: Self-pay

## 2022-04-02 ENCOUNTER — Encounter (INDEPENDENT_AMBULATORY_CARE_PROVIDER_SITE_OTHER): Payer: Self-pay | Admitting: Neurology

## 2022-04-02 NOTE — Telephone Encounter (Signed)
Specialty Pharmacy Note    Date of call: 04/02/2022  Patient: Kaitlin Adams is a 61 y.o. female  Specialty Medication(s): MAYZENT    S:  [name] has missed >4 consecutive doses of Mayzent and is required to restart titration.   Today, she reports continued migraines and weight gain of approx 20 lbs. She thinks Mayzent may be contributing.   Otherwise she is without complaints today. Discussed need to be seen in person to address concerns brought up with clinic pharmacist, Grandville Silos, yesterday on the phone.     O:   CYP2C9*1/CYP2C9*1; NM; QTC=431 (09/25/21)  VZV IGG+  CBC, ANC, ALC=WNL (03/26/22)    A/P:  #Counseled on restart titration for 2 mg dose of Mayzent. She will be dispensed the 5-day titration kit plus 32-monthof maintenance dose today.   #Instructed to keep full bottle of 2 mg Mayzent stored in the fridge until she finishes her titration pack.   #Follow titration pack instructions, 0.25 mg tabs: #1 tab day 1, #2 tabs days 2-3, #3 tabs day 4, #5 tabs on day 5.   #Reviewed common side effects, encouraged patient to report new symptoms of dizziness, slow or abnormal heartbeat, tiredness, or chest pain to neuro clinic immediately.     EMarnee Spring PHARMD, BCPS  04/02/2022, 11:11

## 2022-04-03 ENCOUNTER — Telehealth (INDEPENDENT_AMBULATORY_CARE_PROVIDER_SITE_OTHER): Payer: Self-pay | Admitting: Neurology

## 2022-04-03 ENCOUNTER — Encounter (INDEPENDENT_AMBULATORY_CARE_PROVIDER_SITE_OTHER): Payer: Self-pay | Admitting: Neurology

## 2022-04-03 NOTE — Telephone Encounter (Signed)
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  No answer on patient's preferred number.  Left a detailed message asking patient to call us back to set up video appt to discuss her symptoms and evaluate.  Left call back number [phone] for patient to call back if she gets message.

## 2022-04-03 NOTE — Telephone Encounter (Signed)
-----   Message from Kaitlin Adams sent at 04/02/2022 10:40 PM EDT -----  Regarding: Kaitlin Adams  Contact: [phone]  My eyes are swelling. Nothing new or different. Just FYI.

## 2022-04-03 NOTE — Telephone Encounter (Signed)
Appt tab Wait list and Pt message updated w/ appt information for call center to see & schedule - per provider(s) advisement.    Ane Payment, RN  04/03/2022, 12:08

## 2022-04-11 ENCOUNTER — Encounter (INDEPENDENT_AMBULATORY_CARE_PROVIDER_SITE_OTHER): Payer: Self-pay | Admitting: Neurology

## 2022-04-14 ENCOUNTER — Other Ambulatory Visit: Payer: Self-pay

## 2022-04-23 ENCOUNTER — Ambulatory Visit (INDEPENDENT_AMBULATORY_CARE_PROVIDER_SITE_OTHER): Payer: Self-pay | Admitting: Neurology

## 2022-04-28 ENCOUNTER — Other Ambulatory Visit: Payer: Self-pay

## 2022-04-30 ENCOUNTER — Other Ambulatory Visit: Payer: Self-pay

## 2022-05-04 ENCOUNTER — Other Ambulatory Visit: Payer: Self-pay

## 2022-05-05 ENCOUNTER — Other Ambulatory Visit: Payer: Self-pay

## 2022-05-14 ENCOUNTER — Other Ambulatory Visit: Payer: Self-pay | Admitting: Pharmacist

## 2022-05-14 NOTE — Telephone Encounter (Signed)
Specialty Pharmacy Follow-up Assessment Note    Date of assessment: 05/05/2022 (late documentation)  Patient: Kaitlin Adams is a 61 y.o. female  Diagnosis: RRMS  Specialty Medication(s): Mayzent 2 mg tablet by mouth daily  Prescriber: Rosalyn Gess, MD  Initiation of therapy: 10/09/21 (restarted titration 04/06/22)    Subjective:  Spoke with patient for initial follow-up regarding Mayzent. She missed more than 4 consecutive doses last month and restarted titration. Continues to take daily; reports 0 missed doses and using calendar as a method of adherence. Regarding adherence patient has 7 tablets left on hand.  Reports the following side effects: patient has a rash on her leg but is unsure what it is coming from. She hasn't used any other new medications, lotions, or detergents. She is taking hydroxyzine, which helps with the itching. However, after she takes her Mayzent tablet, the itching comes back.    Patient reports the following signs/symptoms of RRMS: still having headaches. Her legs will give out and her knees will buckle still. Patient continues to work towards current goal of prevent relapse/disease progression.  Denies missed days from work or planned activities due to disease. Denies recent hospitalizations/ER/urgent care visits in the past month related to diagnosis.      Obtained updated medication list with note of the following discrepancies: none    Objective:  Pertinent labs include:   CMP WNL 03/27/21    Assessment:  Reviewed medications, conditions, and allergies with no known contraindications for continuation in therapy.  No significant drug-drug interactions expected.  Reported medication on hand matches fill records and demonstrates adherence.     Patient is working towards therapeutic goal of: prevent relapse/disease progression.     Plan:   Continue therapy: therapy appropriate.  Patient declined medication education. Patient is unsure if rash is being caused by Mayzent. Encouraged her to keep a  diary of symptoms to see if a pattern of the rash occurs after taking Mayzent, she voiced understanding.    Scheduled delivery of Mayzent for 6/9. Scheduled pharmacist follow-up for 3 months. Patient expressed no additional questions and was encouraged to contact [facility] with any questions or concerns.     Greer Pickerel, PHARMD  05/14/2022, 15:23

## 2022-05-19 ENCOUNTER — Encounter (INDEPENDENT_AMBULATORY_CARE_PROVIDER_SITE_OTHER): Payer: Self-pay | Admitting: Neurology

## 2022-05-27 ENCOUNTER — Other Ambulatory Visit: Payer: Self-pay

## 2022-06-01 ENCOUNTER — Ambulatory Visit (INDEPENDENT_AMBULATORY_CARE_PROVIDER_SITE_OTHER): Payer: Self-pay | Admitting: Neurology

## 2022-06-01 ENCOUNTER — Other Ambulatory Visit: Payer: Self-pay

## 2022-06-03 ENCOUNTER — Other Ambulatory Visit: Payer: Self-pay

## 2022-06-08 ENCOUNTER — Other Ambulatory Visit: Payer: Self-pay

## 2022-06-08 ENCOUNTER — Other Ambulatory Visit (INDEPENDENT_AMBULATORY_CARE_PROVIDER_SITE_OTHER): Payer: Self-pay | Admitting: Neurology

## 2022-06-08 NOTE — Telephone Encounter (Signed)
Received VM from patient advising she is completely out of seizure medication (Keppra), pharmacy provided with a temporary supply but needs new RX asap. Also requested refill on Mayzent as only has 3 days remaining. Message sent to [facility] regarding Mayzent. Will send refill request to Dr. Elesa Massed. Last seen 10/30/21 via televisit. F/U 7/3 cancelled. Recommend obtaining updated weight to verify dosing is appropriate for renal function after this fill.    Maylon Cos, [facility]  06/08/2022, 16:57    MyChart message sent to patient advising of need for follow up appointment. Last visit was a televisit 10/30/21. Current Mayzent authorization will end in October, updated clinical notes will be required for renewal.  Also requested updated weight.   [place], PHARMD  06/09/2022, 12:08

## 2022-06-09 ENCOUNTER — Other Ambulatory Visit: Payer: Self-pay

## 2022-06-09 MED ORDER — LEVETIRACETAM 500 MG TABLET
1000.0000 mg | ORAL_TABLET | Freq: Two times a day (BID) | ORAL | 1 refills | Status: DC
Start: 2022-06-09 — End: 2022-08-07

## 2022-06-11 ENCOUNTER — Other Ambulatory Visit: Payer: Self-pay

## 2022-06-22 ENCOUNTER — Other Ambulatory Visit: Payer: Self-pay

## 2022-06-23 ENCOUNTER — Other Ambulatory Visit: Payer: Self-pay

## 2022-07-01 ENCOUNTER — Other Ambulatory Visit: Payer: Self-pay

## 2022-07-02 ENCOUNTER — Other Ambulatory Visit: Payer: Self-pay

## 2022-07-06 ENCOUNTER — Encounter (INDEPENDENT_AMBULATORY_CARE_PROVIDER_SITE_OTHER): Payer: Self-pay | Admitting: Neurology

## 2022-07-06 DIAGNOSIS — G35 Multiple sclerosis: Secondary | ICD-10-CM

## 2022-07-10 ENCOUNTER — Other Ambulatory Visit: Payer: Self-pay

## 2022-07-10 ENCOUNTER — Other Ambulatory Visit: Payer: Self-pay | Admitting: GENERAL

## 2022-07-10 NOTE — Telephone Encounter (Signed)
Specialty Pharmacy Follow-up Assessment Note    Date of assessment: 07/10/2022  Patient: Kaitlin Adams is a 61 y.o. female  Diagnosis: RRMS  Specialty Medication(s): Mayzent 2 mg tablet by mouth daily  Prescriber: Rosalyn Gess, MD  Initiation of therapy: 10/09/21 (restarted titration 04/06/2022)    Subjective:  Spoke with patient for an additional follow-up regarding Mayzent. Continues to take medication once daily; reports no missed doses and not currently using anything as a method of adherence. Regarding adherence patient has 1 tablet left on hand.  Reports the following side effects: none- denies intolerable side effects.    Patient reports the following signs/symptoms of RRMS: headaches, numbness in her toes and fingers, back and neck pain on the left side, and ms tugs/chest tightness.  Notes she has been under a great deal of stress.  Considering current health conditions, patient scores overall health as no different since last assessment.  Patient continues to work towards current goal of preventing relapse/disease progression.  Denies missed days from work or planned activities due to disease. Denies recent hospitalizations/ER/urgent care visits in the past month related to diagnosis.      Obtained updated medication list with note of the following discrepancies: no changes per patient    Objective:  Pertinent labs include: ***    Assessment:  Reviewed medications, conditions, and allergies with no known contraindications for continuation in therapy.  No significant drug-drug interactions expected.  Reported medication on hand {matches/does not match/adherence/non-adherence:37857} fill records and demonstrates {matches/does not match/adherence/non-adherence:37857}.     Patient is maintaining ***working towards therapeutic goal of: ***.     Plan:   Continue therapy: {therapy appropriate/ appropriate with intervention/ inappropriate:37828}.  Patient declined medication education.  Reviewed importance of compliance  and calling pharmacy when she gets down 5-7 tablets.    Scheduled delivery of *** for *** with ***. Scheduled pharmacist follow-up for ***.  Reminded *** of appointment on ***.  Patient expressed no additional questions and was encouraged to contact [facility] with any questions or concerns.     Volney Reierson [place], PHARMD  07/10/2022, 16:55    Dafney Farler Blase Mess  07/10/2022, 17:58   Delivery Date: Mon 8/14 for Tues 8/15 via UPS at address [address]  Payment: none  Supplies: none  Signature Required: Signature required/requested ([employer] ok)  Animal: Dog(s)  Insurance verified: {Insurance:[id number]}    Therigy assessment complete. Spoke with patient, who confirmed med/dosage. Scheduled the following medication(s) MAyzent. Reports no changes since the last delivery and no missed doses. Patient has 1 remaining and will need current delivery by 8/13- but will resume with 2 missed doses on 8/15. No questions/concerns for the pharmacist.

## 2022-07-10 NOTE — Telephone Encounter (Signed)
Per pt request - new MRI orders placed to be faxed to [facility].  Pt to call and schedule.

## 2022-07-13 ENCOUNTER — Other Ambulatory Visit: Payer: Self-pay

## 2022-07-13 ENCOUNTER — Encounter (HOSPITAL_COMMUNITY): Payer: Self-pay

## 2022-07-24 NOTE — Telephone Encounter (Addendum)
Updated Auth and Orders faxed    Regarding: ward  ----- Message from Sherril Cong sent at 07/16/2022 12:54 PM EDT -----  Inetta Fermo needs the auth extended on the MRIs and faxed to her at [phone]

## 2022-07-28 ENCOUNTER — Encounter (INDEPENDENT_AMBULATORY_CARE_PROVIDER_SITE_OTHER): Payer: Self-pay | Admitting: Neurology

## 2022-08-04 ENCOUNTER — Other Ambulatory Visit: Payer: Self-pay

## 2022-08-07 ENCOUNTER — Other Ambulatory Visit: Payer: Self-pay

## 2022-08-07 MED ORDER — LEVETIRACETAM 500 MG TABLET
1000.0000 mg | ORAL_TABLET | Freq: Two times a day (BID) | ORAL | 3 refills | Status: DC
Start: 2022-08-07 — End: 2022-12-08

## 2022-08-07 NOTE — Telephone Encounter (Signed)
Regarding: Ward  ----- Message from Blenda Peals sent at 08/07/2022  9:48 AM EDT -----  Kaitlin Adams needs a refill on her levETIRAcetam (KEPPRA) 500 mg Oral Tablet    she has enough meds for 5 days    Preferred Pharmacy     [facility] [address], [place] - [address]    [address] [place] [address]    Phone: [phone] Fax: [phone]    Hours: Not open 24 hours

## 2022-08-09 ENCOUNTER — Encounter (INDEPENDENT_AMBULATORY_CARE_PROVIDER_SITE_OTHER): Payer: Self-pay

## 2022-08-11 ENCOUNTER — Other Ambulatory Visit: Payer: Self-pay

## 2022-08-11 ENCOUNTER — Other Ambulatory Visit: Payer: Self-pay | Admitting: Pharmacist

## 2022-08-11 NOTE — Telephone Encounter (Signed)
Specialty Pharmacy Follow-up Assessment Note    Date of assessment: 08/11/2022  Patient: Kaitlin Adams is a 61 y.o. female  Diagnosis: RRMS  Specialty Medication(s): Mayzent 2 mg tablet by mouth daily  Prescriber: Rosalyn Gess, MD  Initiation of therapy: 10/09/21    Subjective:  Spoke with patient for semi-annual follow-up regarding Mayzent. Continues to take daily; reports 0 missed doses and using pill box as a method of adherence. Regarding adherence patient took last dose yesterday and has 3 tablets left on hand.  Reports the following side effects: none.    Patient reports the following signs/symptoms of RRMS: left leg cramps, has muscle spasms and cramps during the night. Numbness and toes and fingers. Denies any recent headaches or seizures.  Considering current health conditions, patient scores overall health as worse since last assessment.  Patient continues to work towards current goal of prevent relapse.  Denies missed days from work or planned activities due to disease. Denies recent hospitalizations/ER/urgent care visits in the past month related to diagnosis.      Obtained updated medication list with note of the following discrepancies: none    Objective:  Pertinent labs include:   Quant TB neg 03/27/21  Hep B neg 03/27/21    Assessment:  Reviewed medications, conditions, and allergies with no known contraindications for continuation in therapy.  No significant drug-drug interactions expected.  Reported medication on hand matches fill records and demonstrates adherence.     Patient is working towards therapeutic goal of: prevent relapse/disease progression.     Plan:   Continue therapy: therapy appropriate.  Patient declined medication education. Patient has MRI scheduled for 10/3.    Scheduled delivery of Mayzent for 9/13. Refill due 9/15. Scheduled pharmacist follow-up for 6 months.  Patient expressed no additional questions and was encouraged to contact [facility] with any questions or  concerns.     Greer Pickerel, PHARMD  08/11/2022, 11:39

## 2022-08-30 ENCOUNTER — Encounter (INDEPENDENT_AMBULATORY_CARE_PROVIDER_SITE_OTHER): Payer: Self-pay | Admitting: Neurology

## 2022-08-31 ENCOUNTER — Encounter (INDEPENDENT_AMBULATORY_CARE_PROVIDER_SITE_OTHER): Payer: Self-pay | Admitting: Neurology

## 2022-09-01 IMAGING — MR MRI THORACIC SPINE WITHOUT AND WITH CONTRAST
6 of 8 series · 31 of 48 positions shown · IV contrast (gadavist)
Comparison: September 25, 2019

﻿EXAM:  92009   MRI THORACIC SPINE WITHOUT AND WITH CONTRAST
INDICATION: MS.
TECHNIQUE: Multiplanar, multisequence MRI was performed both before and after the intravenous administration of 5 mL Gadavist.

[Series 5: T2 · sagittal · 4.0mm · 0.78mm/px · 6 of 13 slices shown (1 of 2)]
[im 1/13]
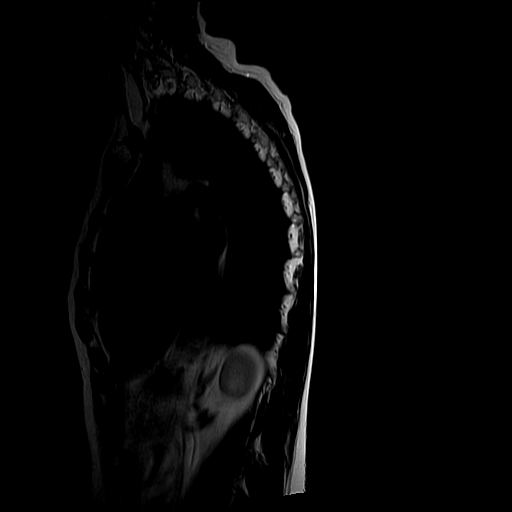
[im 3/13]
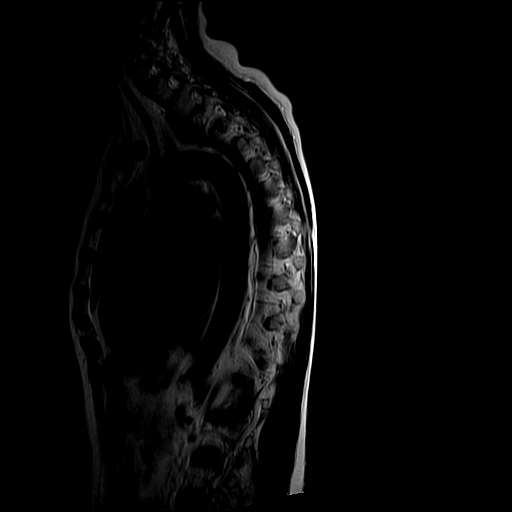
[im 5/13]
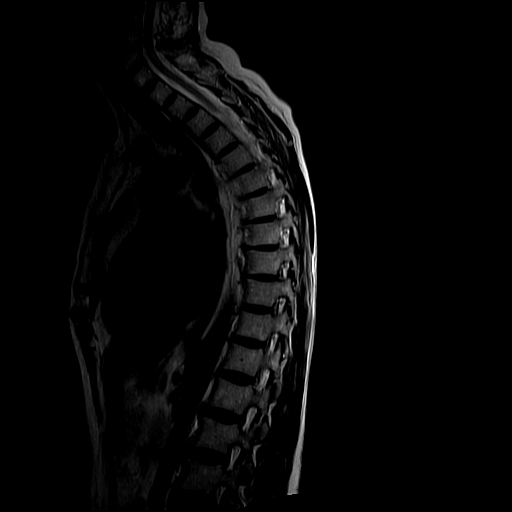
[im 8/13]
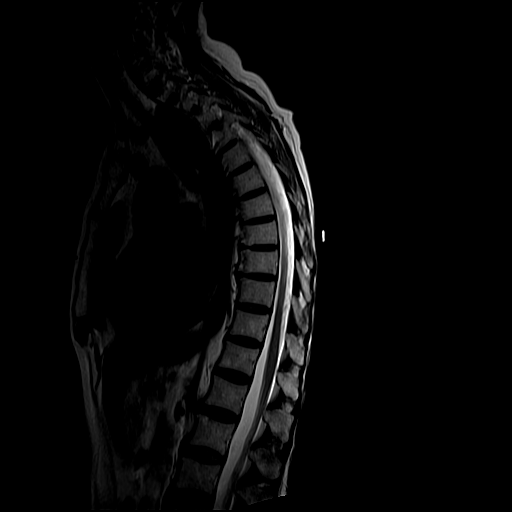
[im 10/13]
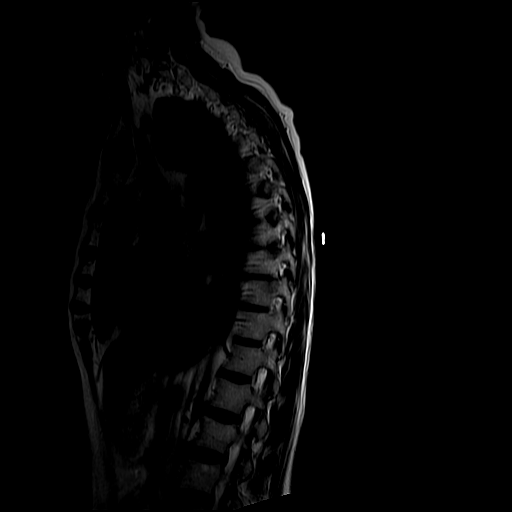
[im 13/13]
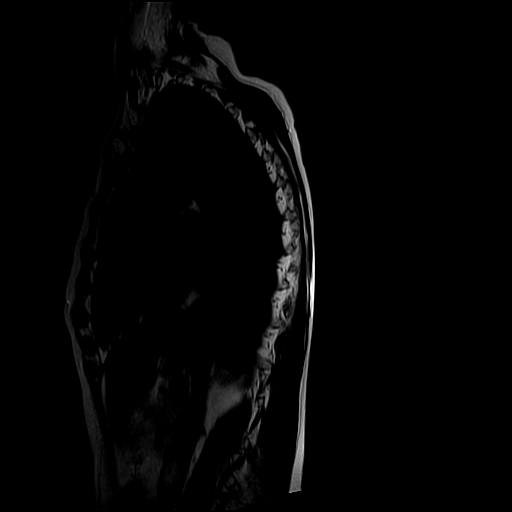

[Series 6: T1 · sagittal · 4.0mm · 0.78mm/px · 6 of 13 slices shown]
[im 1/13]
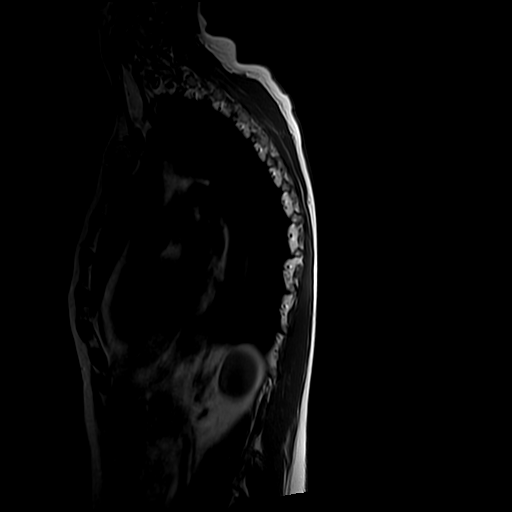
[im 3/13]
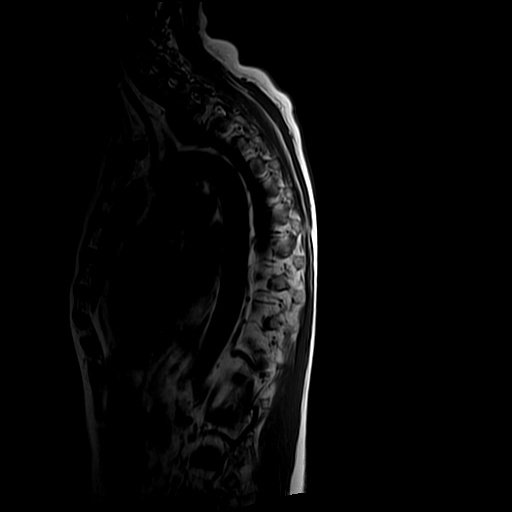
[im 5/13]
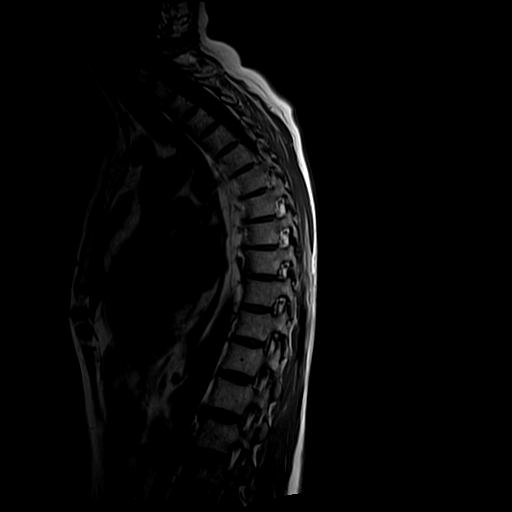
[im 8/13]
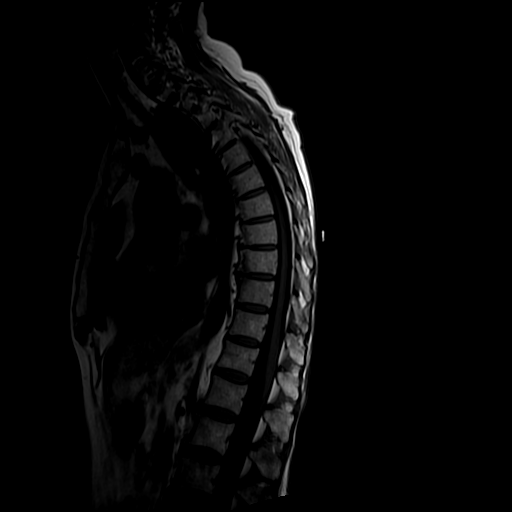
[im 10/13]
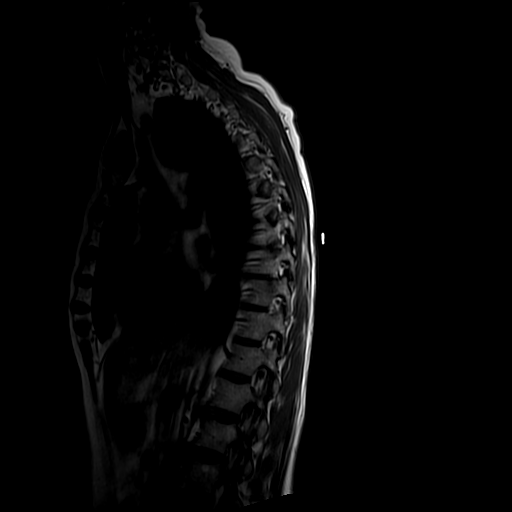
[im 13/13]
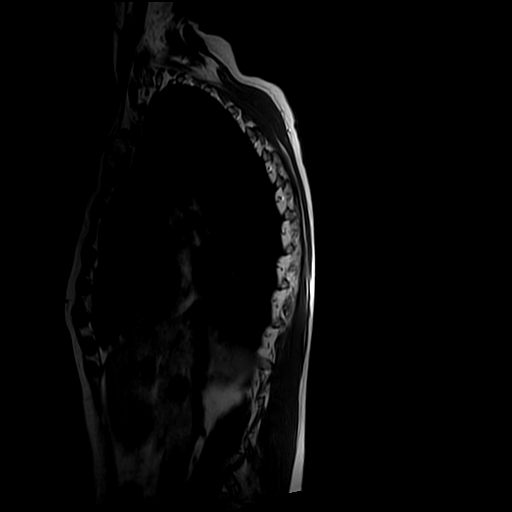

[Series 9: T2 · axial · 4.0mm · 0.62mm/px · z∈[-183,-82]mm · 6 of 12 slices shown (2 of 2)]
[im 1/12]
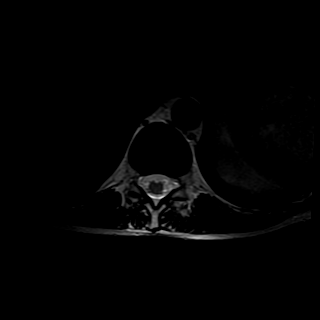
[im 3/12]
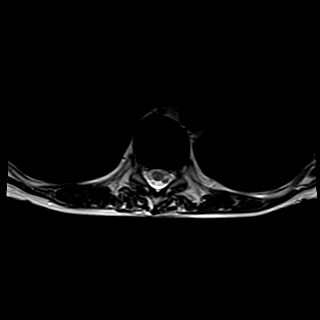
[im 5/12]
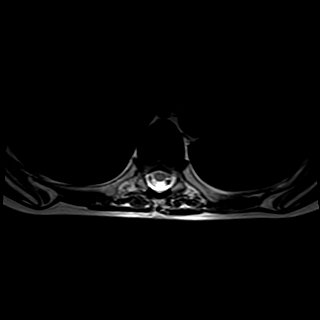
[im 7/12]
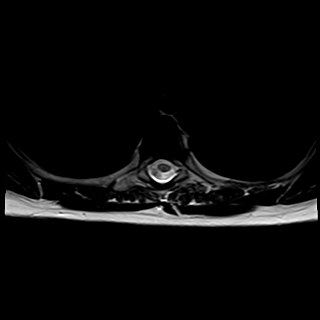
[im 9/12]
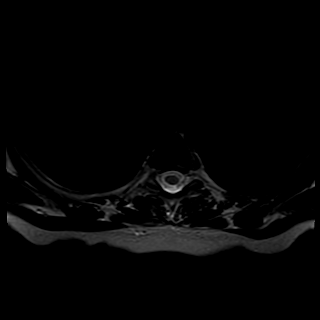
[im 12/12]
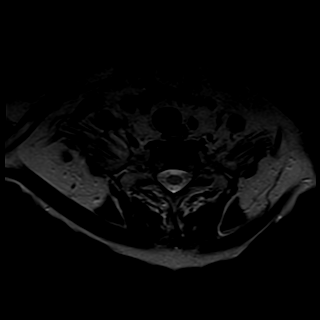

[Series 10: T1 fat-sat · axial · 4.0mm · 0.78mm/px · z∈[-183,-82]mm · 6 of 12 slices shown (1 of 2)]
[im 1/12]
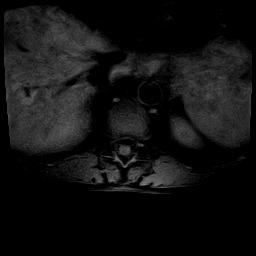
[im 3/12]
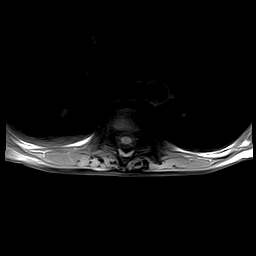
[im 5/12]
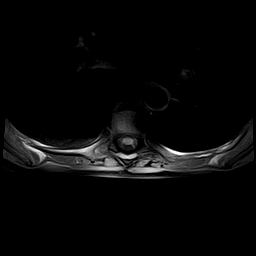
[im 7/12]
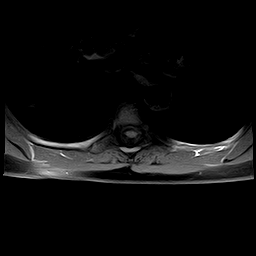
[im 9/12]
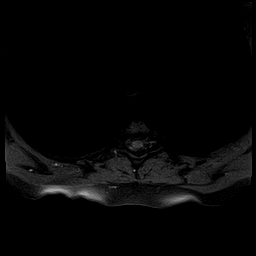
[im 12/12]
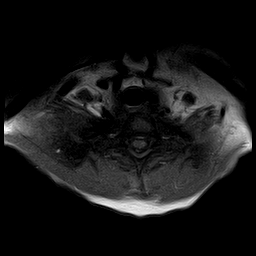

[Series 19: T1 fat-sat post-contrast · axial · 4.0mm · 0.78mm/px · z∈[-183,-82]mm · 6 of 12 slices shown]
[im 1/12]
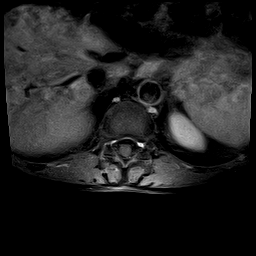
[im 3/12]
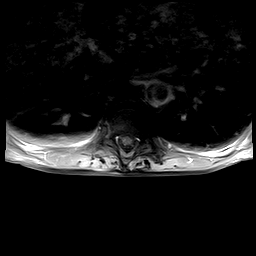
[im 5/12]
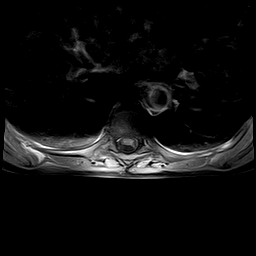
[im 7/12]
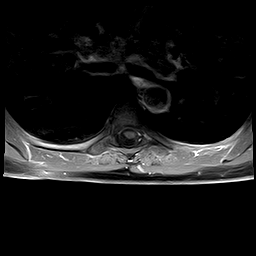
[im 9/12]
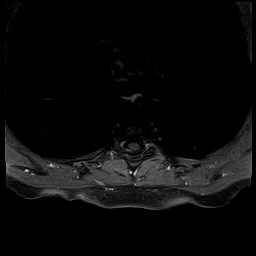
[im 12/12]
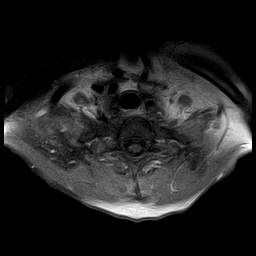

[Series 20: T1 fat-sat · sagittal · 4.0mm · 0.78mm/px · 1 of 13 slices shown (2 of 2)]
[im 1/13]
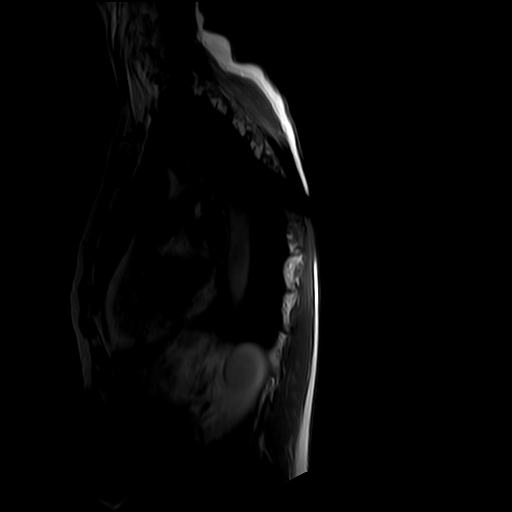

[31 of 48 positions shown; findings below may reference images not displayed]

FINDINGS: There is a small plaque within the mid thoracic spinal cord at the level of T8.  This appears unchanged compared to September 25, 2019.  No definite enhancement is seen in this region.  

There appear to be two smaller plaques as well that are unchanged, one at T10 and the other at T3.  

No new demyelinating disease is seen.  There is no fracture, malalignment, disc herniation, marrow signal alteration, or spinal stenosis.  

Cervical spinal cord disease is noted incidentally.  The patient is reportedly scheduled for a cervical spine MRI on September 28, 2022.
IMPRESSION: No significant change compared to September 25, 2019.

## 2022-09-02 NOTE — Telephone Encounter (Addendum)
Responded to [employer].      ----- Message from Nolen Mu, RN sent at 08/31/2022  3:04 PM EDT -----  Regarding: FW: Jerzy Crotteau  ----- Message from Brain Hilts sent at 08/31/2022  2:45 PM EDT -----  Patient stated her father has an untreatable bacteria, they will be sending him home for hospice. She is wanting to know if with her compromised immune system if it is safe for her to be near him.

## 2022-09-03 ENCOUNTER — Other Ambulatory Visit: Payer: Self-pay

## 2022-09-24 ENCOUNTER — Encounter (INDEPENDENT_AMBULATORY_CARE_PROVIDER_SITE_OTHER): Payer: Medicaid Other | Admitting: Neurology

## 2022-10-02 ENCOUNTER — Other Ambulatory Visit: Payer: Self-pay

## 2022-10-02 ENCOUNTER — Ambulatory Visit (INDEPENDENT_AMBULATORY_CARE_PROVIDER_SITE_OTHER): Payer: Self-pay | Admitting: Nurse Practitioner

## 2022-10-02 NOTE — Nursing Note (Signed)
Brain denied- C Spine approved. Resubmitted for Brain  as patient didn't have testing completed. Faxed C Spine approval to [facility]    Nolen Mu, RN

## 2022-10-02 NOTE — Telephone Encounter (Signed)
Regarding: [place] - MRI  ----- Message from Herbert Deaner sent at 09/24/2022  2:02 PM EDT -----  NPI Number [id number]   Tax ID number [id number]    Will you please call Scot Dock when [place] comes through [phone] ext [phone]      Thanks Claiborne Billings

## 2022-10-05 ENCOUNTER — Other Ambulatory Visit: Payer: Self-pay

## 2022-10-05 ENCOUNTER — Ambulatory Visit (INDEPENDENT_AMBULATORY_CARE_PROVIDER_SITE_OTHER): Payer: Self-pay | Admitting: Neurology

## 2022-10-05 NOTE — Telephone Encounter (Signed)
Prior authorization for Mayzent submitted via faxed form on 10/05/2022. Waiting for response from payor.    Julien Nordmann, Pharmacy Technician

## 2022-10-05 NOTE — Nursing Note (Signed)
MRI Brain Kaitlin Adams is good until 11/23. Faxed into to [facility] who was awaiting it.    Nolen Mu, RN

## 2022-10-05 NOTE — Telephone Encounter (Signed)
Regarding: Ward  ----- Message from Pascal Lux sent at 10/05/2022 11:14 AM EST -----  RE Brain MRI Josem Kaufmann    It was authorized in August.    She is asking why a new Josem Kaufmann is needed.

## 2022-10-09 ENCOUNTER — Other Ambulatory Visit: Payer: Self-pay

## 2022-10-09 NOTE — Telephone Encounter (Signed)
Faxed PA again.    Kenyatta Keidel, Pharmacy Technician

## 2022-10-12 ENCOUNTER — Other Ambulatory Visit: Payer: Self-pay

## 2022-10-12 NOTE — Telephone Encounter (Signed)
Specialty Pharmacy Note    Prior Authorization for medication Mayzent has been approved by payor RDT until 10/03/23.  Approval notice has been scanned into Epic and can be found in the Media tab.     If you have any questions, don't hesitate to contact the [place], Pharmacy Technician

## 2022-10-19 ENCOUNTER — Ambulatory Visit (INDEPENDENT_AMBULATORY_CARE_PROVIDER_SITE_OTHER): Payer: Self-pay | Admitting: Neurology

## 2022-10-19 NOTE — Nursing Note (Signed)
Called [employer]- MRI Brain auth extended 90 days past 10/27/22.    Call Ref # [id number]    [facility] with [place], RN

## 2022-10-19 NOTE — Telephone Encounter (Signed)
Regarding: Ward  ----- Message from Ramond Marrow, [place] sent at 10/13/2022  3:29 PM EST -----  Patient needs authorization for MRI Brain w/ w/o extended - can't get in for MRI until Nov 29th. Please work on extension

## 2022-11-02 ENCOUNTER — Other Ambulatory Visit: Payer: Self-pay

## 2022-11-03 ENCOUNTER — Other Ambulatory Visit: Payer: Self-pay

## 2022-11-04 ENCOUNTER — Other Ambulatory Visit: Payer: Self-pay

## 2022-11-05 IMAGING — MR MRI BRAIN WITHOUT AND WITH CONTRAST
9 of 13 series · 32 of 48 positions shown · IV contrast (gadavist)
Comparison: Previous examination dated 02/10/2021.

﻿EXAM:  MRI BRAIN WITHOUT AND WITH CONTRAST
INDICATION: Multiple sclerosis follow-up.
TECHNIQUE: Multiplanar, multisequential MRI of the brain was performed without and with 10 mL of Gadavist.

[Series 5: DWI · axial · 5.0mm · 1.35mm/px · z∈[-66,+60]mm · 8 of 88 slices shown (1 of 3)]
[im 1/88]
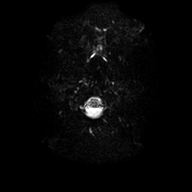
[im 13/88]
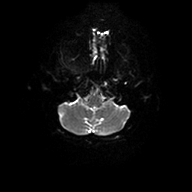
[im 25/88]
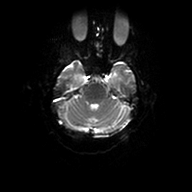
[im 38/88]
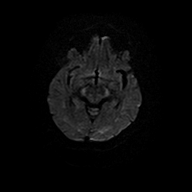
[im 50/88]
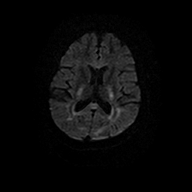
[im 63/88]
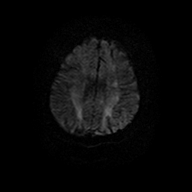
[im 75/88]
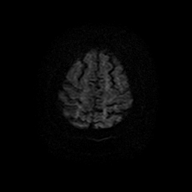
[im 88/88]
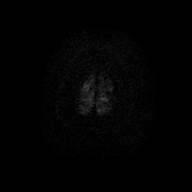

[Series 6: DWI · axial · 5.0mm · 1.35mm/px · 1 of 22 slices shown (2 of 3)]
[im 1/22]
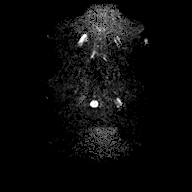

[Series 7: DWI · axial · 5.0mm · 1.35mm/px · z∈[-66,+60]mm · 2 of 22 slices shown (3 of 3)]
[im 1/22]
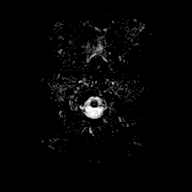
[im 22/22]
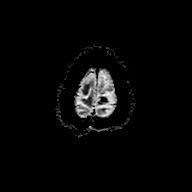

[Series 8: FLAIR · sagittal · 5.0mm · 0.75mm/px · 3 of 32 slices shown (1 of 2)]
[im 1/32]
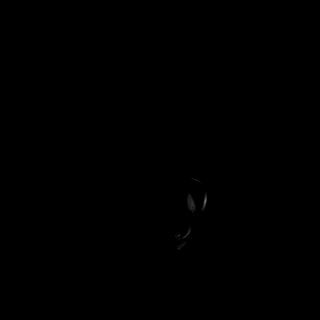
[im 16/32]
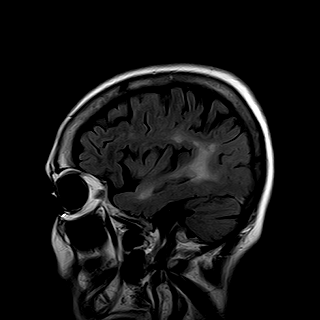
[im 32/32]
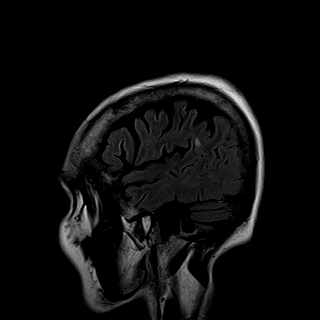

[Series 11: T1 · axial · 4.0mm · 0.69mm/px · z∈[-78,+76]mm · 4 of 36 slices shown]
[im 1/36]
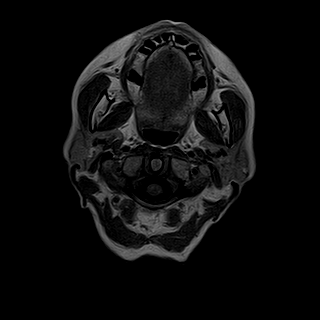
[im 12/36]
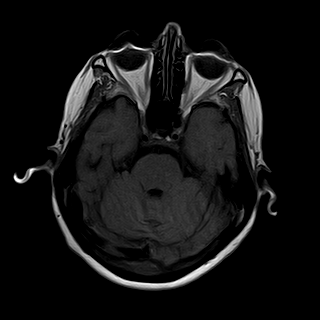
[im 24/36]
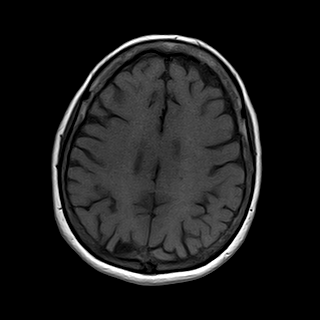
[im 36/36]
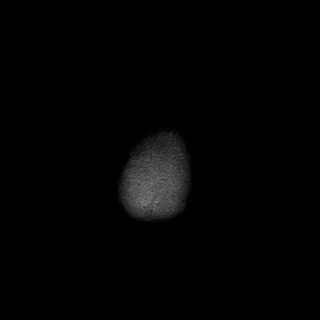

[Series 12: T2 · coronal · 5.0mm · 0.43mm/px · 3 of 28 slices shown]
[im 1/28]
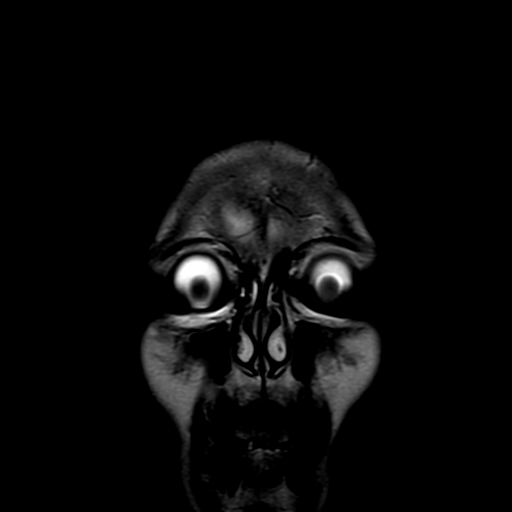
[im 14/28]
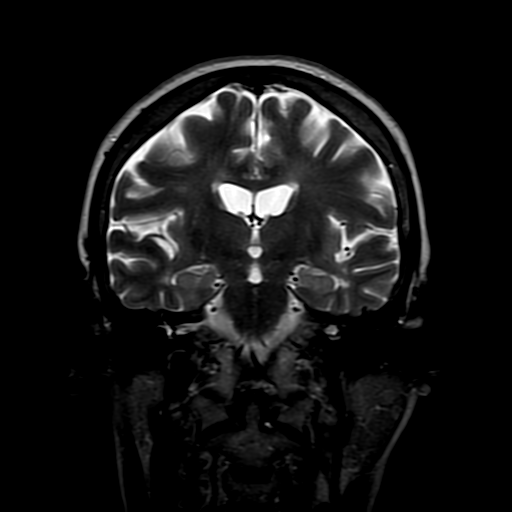
[im 28/28]
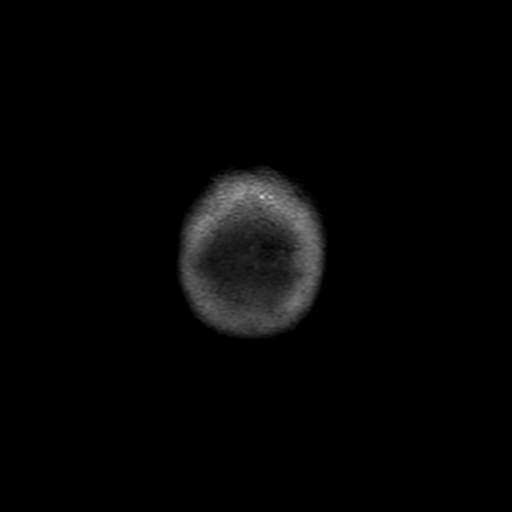

[Series 13: T1 fat-sat · axial · 4.0mm · 0.43mm/px · z∈[-78,+24]mm · 3 of 36 slices shown]
[im 1/36]
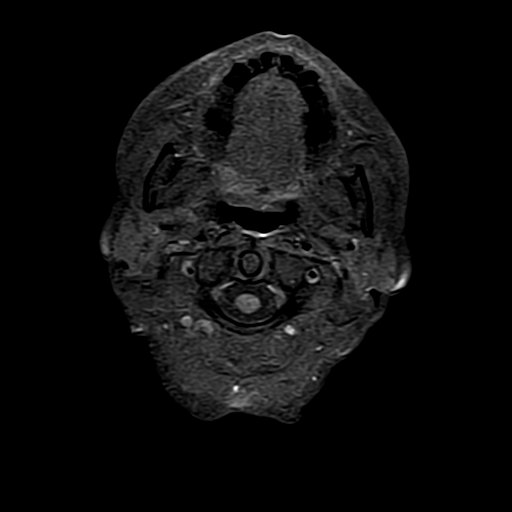
[im 12/36]
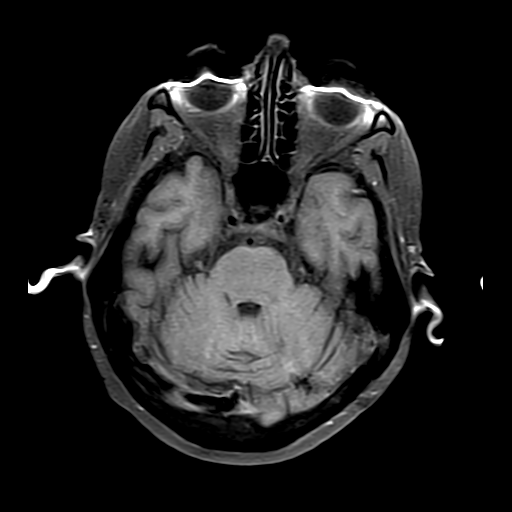
[im 24/36]
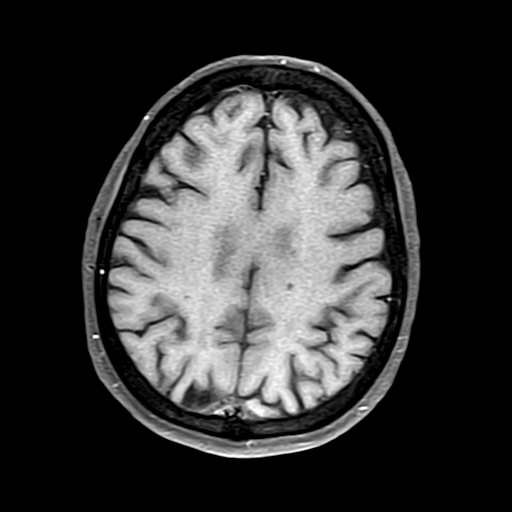

[Series 14: FLAIR · axial · 4.0mm · 0.43mm/px · z∈[-78,+76]mm · 4 of 36 slices shown (2 of 2)]
[im 1/36]
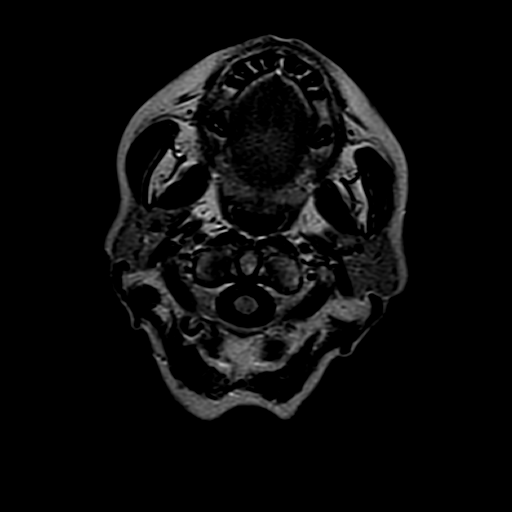
[im 12/36]
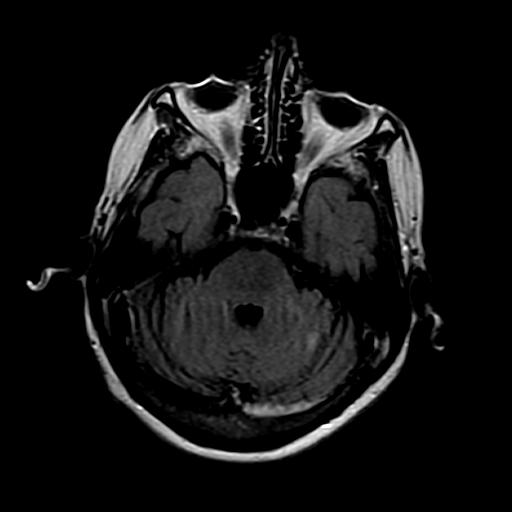
[im 24/36]
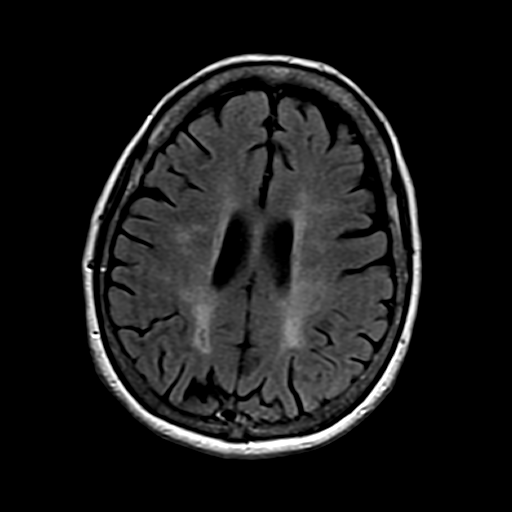
[im 36/36]
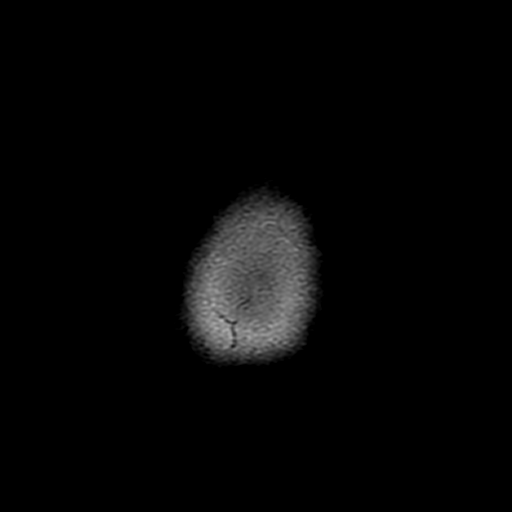

[Series 17: T1 post-contrast · axial · 4.0mm · 0.43mm/px · z∈[-78,+76]mm · 4 of 36 slices shown]
[im 1/36]
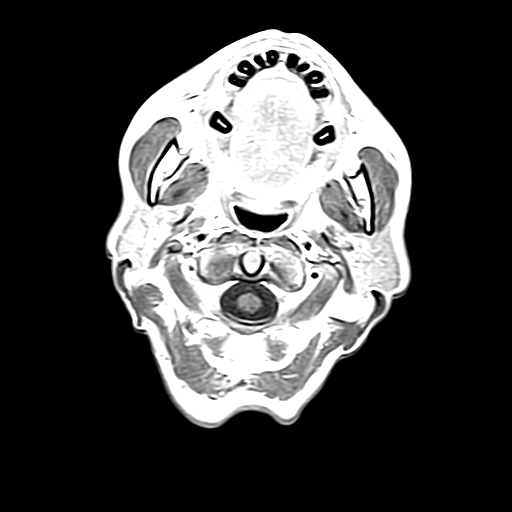
[im 12/36]
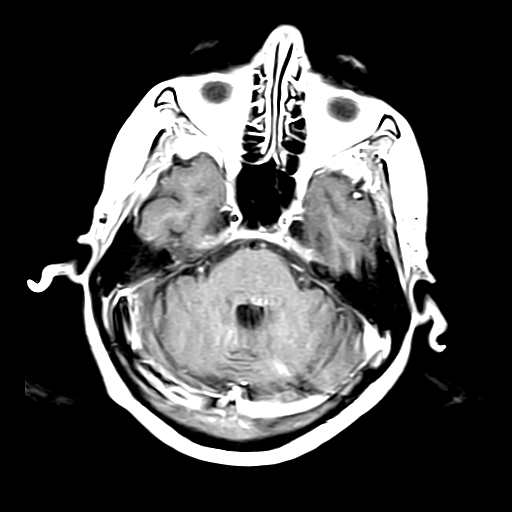
[im 24/36]
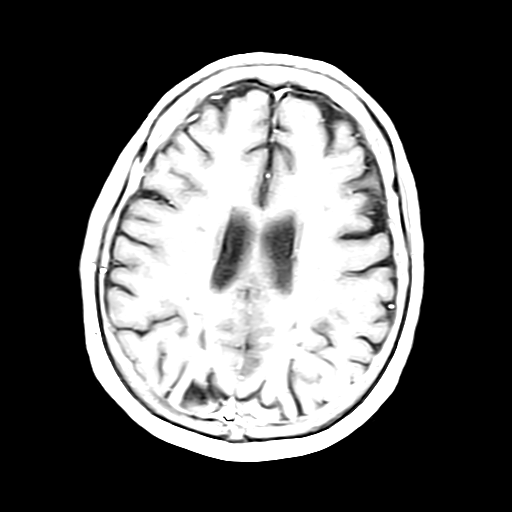
[im 36/36]
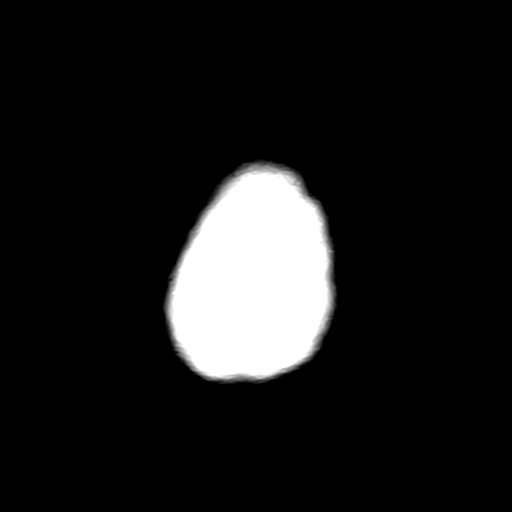

[32 of 48 positions shown; findings below may reference images not displayed]

FINDINGS: No focal areas of restricted diffusion.

Extensive periventricular demyelinating lesions on FLAIR sequence on both sides are noted stable in number and extent.  None of the lesions show enhancement on postcontrast study. 

Minimal FLAIR signal abnormality of the corpus callosum is stable. No abnormal lesions of the brainstem and cerebellum are seen.

Mild cerebral cortical volume loss is stable in appearance.  No ventriculomegaly.  Major vascular structures of the circle-of-Willis are patent.
IMPRESSION: 1. Stable demyelinating lesions of periventricular white matter on both sides compared with prior study.  No new or additional lesions are seen.  No abnormal enhancement on postcontrast study.

2. Stable FLAIR signal increase of corpus callosum.  No involvement of posterior fossa and cerebellum.

3. Overall findings are stable compared with MRI brain dated 02/10/2021.

## 2022-11-06 ENCOUNTER — Encounter (INDEPENDENT_AMBULATORY_CARE_PROVIDER_SITE_OTHER): Payer: Self-pay | Admitting: Neurology

## 2022-11-08 ENCOUNTER — Encounter (INDEPENDENT_AMBULATORY_CARE_PROVIDER_SITE_OTHER): Payer: Self-pay | Admitting: Neurology

## 2022-11-09 ENCOUNTER — Other Ambulatory Visit: Payer: Self-pay

## 2022-11-12 ENCOUNTER — Encounter (INDEPENDENT_AMBULATORY_CARE_PROVIDER_SITE_OTHER): Payer: Self-pay | Admitting: Neurology

## 2022-11-13 ENCOUNTER — Ambulatory Visit (INDEPENDENT_AMBULATORY_CARE_PROVIDER_SITE_OTHER): Payer: Self-pay | Admitting: Neurology

## 2022-11-13 DIAGNOSIS — G35 Multiple sclerosis: Secondary | ICD-10-CM

## 2022-11-14 ENCOUNTER — Other Ambulatory Visit: Payer: Self-pay

## 2022-12-01 ENCOUNTER — Other Ambulatory Visit: Payer: Self-pay

## 2022-12-02 ENCOUNTER — Encounter (INDEPENDENT_AMBULATORY_CARE_PROVIDER_SITE_OTHER): Payer: Self-pay | Admitting: Neurology

## 2022-12-03 ENCOUNTER — Other Ambulatory Visit: Payer: Self-pay

## 2022-12-04 ENCOUNTER — Other Ambulatory Visit: Payer: Self-pay

## 2022-12-08 ENCOUNTER — Other Ambulatory Visit (INDEPENDENT_AMBULATORY_CARE_PROVIDER_SITE_OTHER): Payer: Self-pay | Admitting: Nurse Practitioner

## 2022-12-17 IMAGING — MR MRI CERVICAL SPINE WITHOUT AND WITH CONTRAST
6 of 8 series · 31 of 48 positions shown · IV contrast (gadavist)
Comparison: None available.

﻿EXAM:  06873   MRI CERVICAL SPINE WITHOUT AND WITH CONTRAST
INDICATION: Follow-up multiple sclerosis.
TECHNIQUE: Multiplanar, multisequence MRI was performed both before and after the intravenous administration of 7 mL Gadavist. 

There are mild degenerative changes at multiple levels.  There is no fracture, malalignment, marrow signal alteration, spinal stenosis, disc herniation, or Chiari malformation.  The lower cervical spinal cord and the visualized thoracic spinal cord appear unremarkable.

[Series 5: T2 · sagittal · 3.0mm · 0.75mm/px · 5 of 13 slices shown (1 of 2)]
[im 1/13]
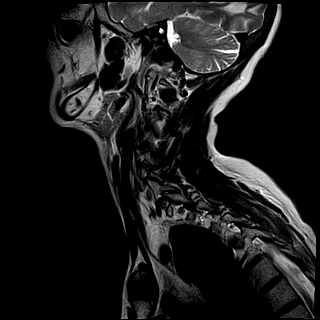
[im 4/13]
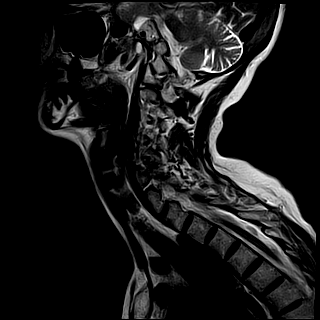
[im 7/13]
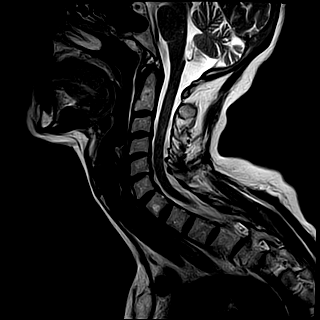
[im 10/13]
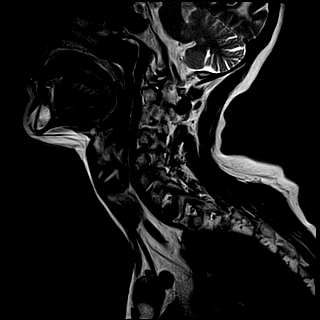
[im 13/13]
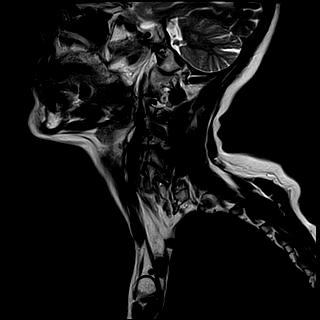

[Series 6: T1 · sagittal · 3.0mm · 0.47mm/px · 4 of 13 slices shown]
[im 1/13]
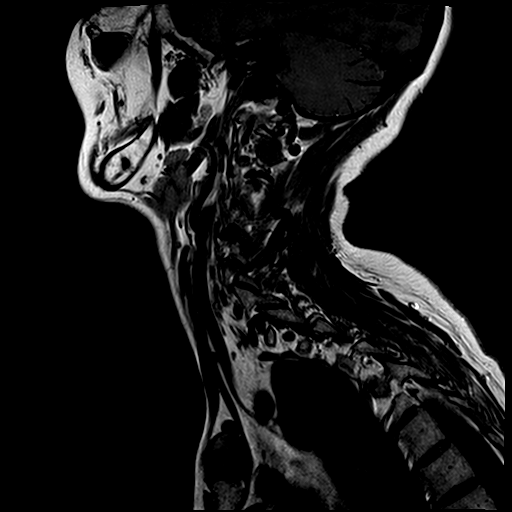
[im 5/13]
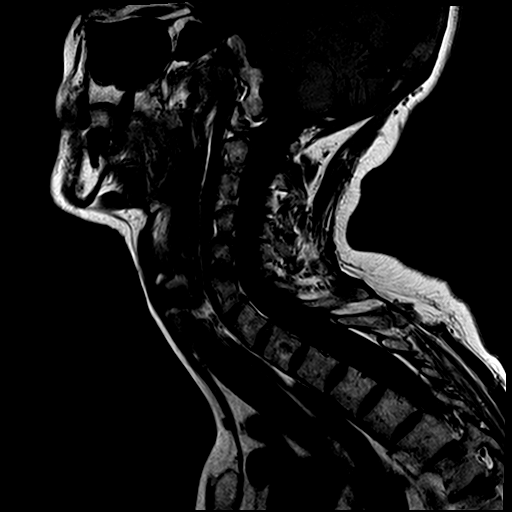
[im 9/13]
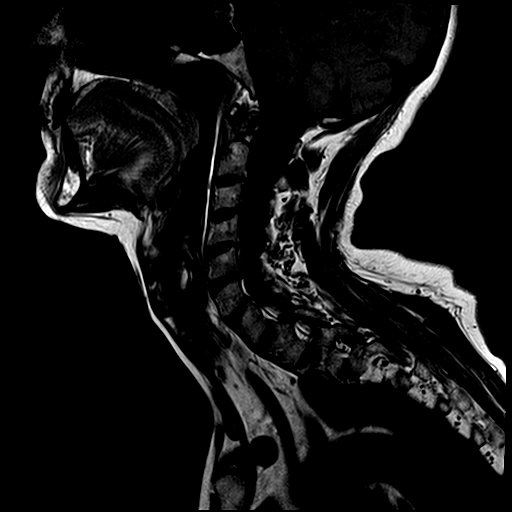
[im 13/13]
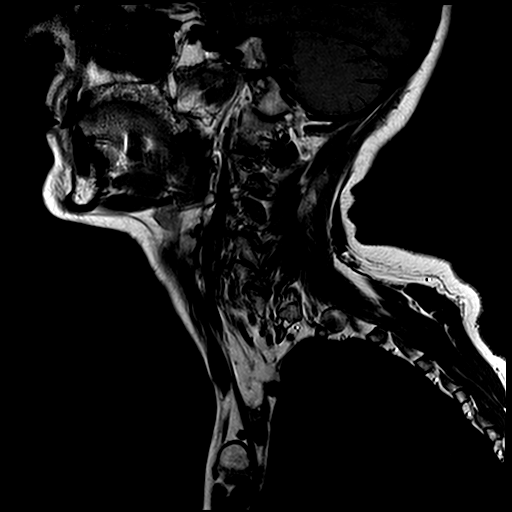

[Series 8: T1 fat-sat · sagittal · 3.0mm · 0.75mm/px · 4 of 13 slices shown (1 of 2)]
[im 1/13]
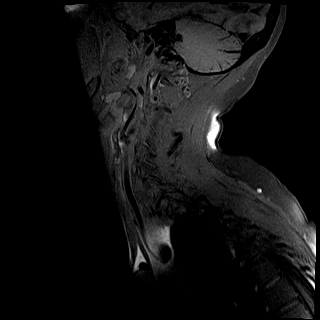
[im 5/13]
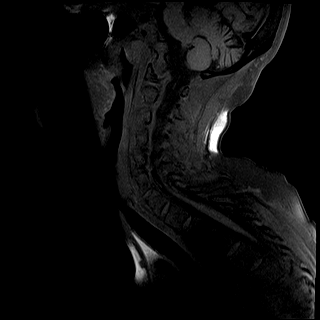
[im 9/13]
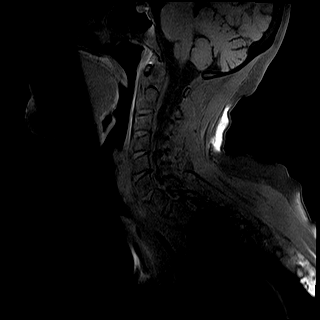
[im 13/13]
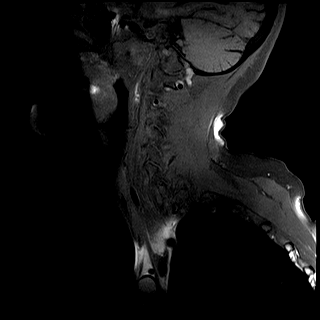

[Series 9: T2 · axial · 3.0mm · 0.35mm/px · z∈[-53,+45]mm · 9 of 26 slices shown (2 of 2)]
[im 1/26]
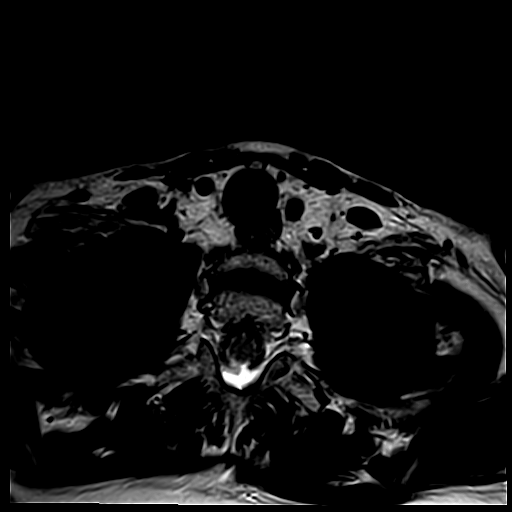
[im 4/26]
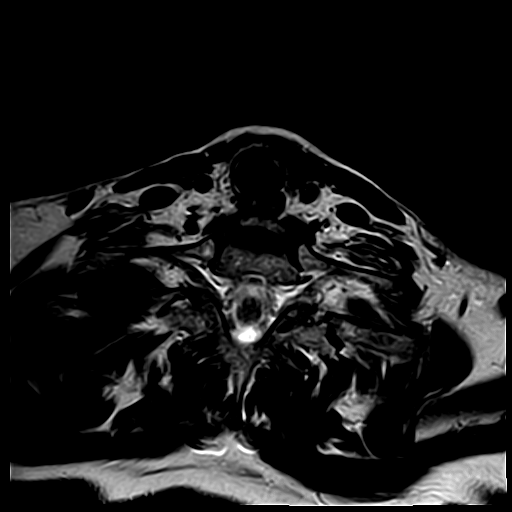
[im 7/26]
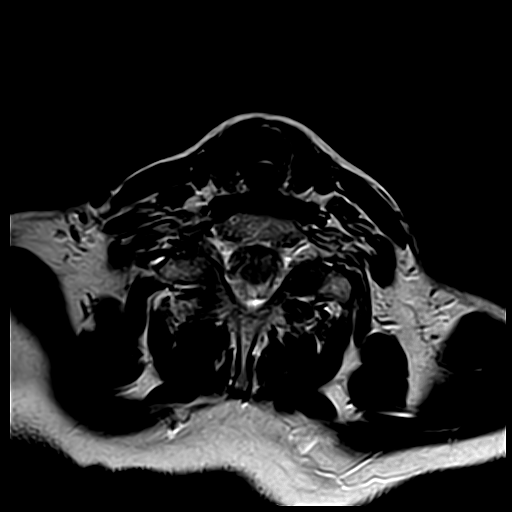
[im 10/26]
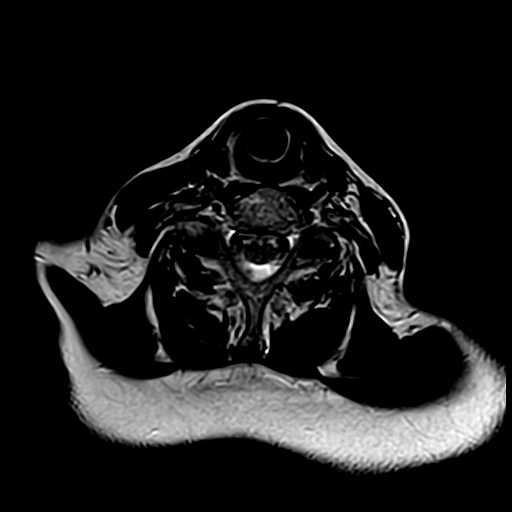
[im 13/26]
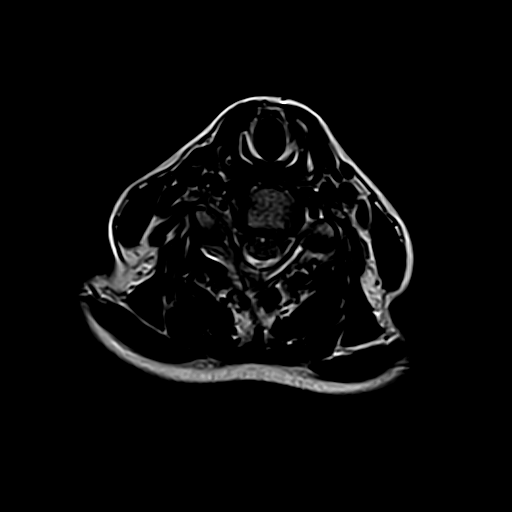
[im 16/26]
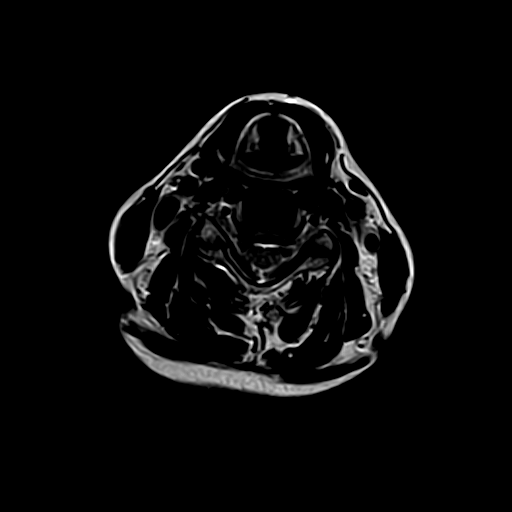
[im 19/26]
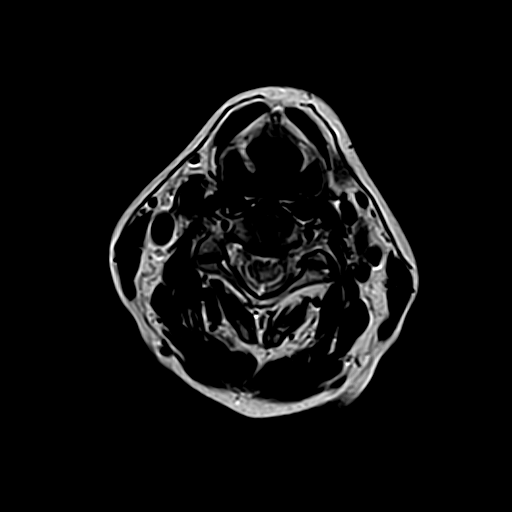
[im 22/26]
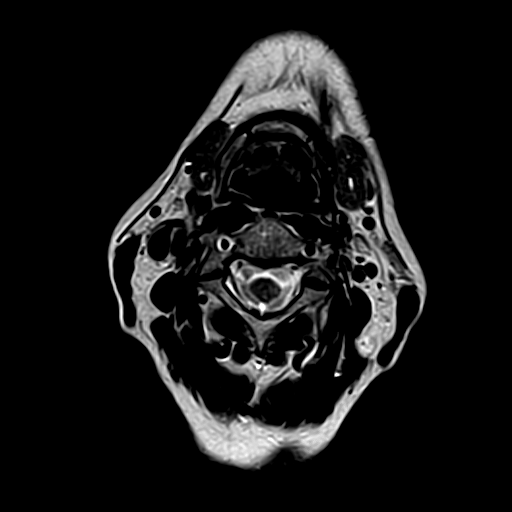
[im 26/26]
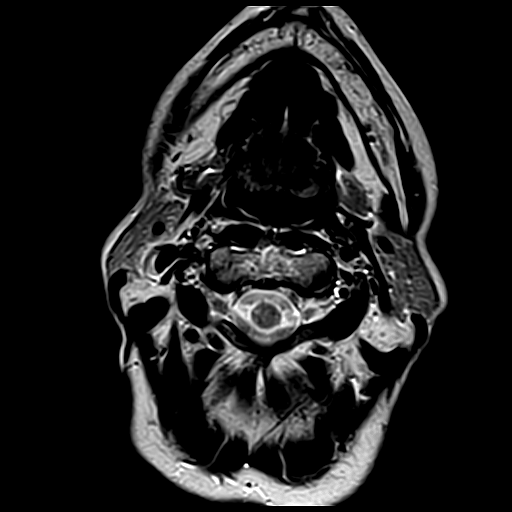

[Series 10: T1 fat-sat · axial · 3.0mm · 0.70mm/px · z∈[-53,+45]mm · 8 of 26 slices shown (2 of 2)]
[im 1/26]
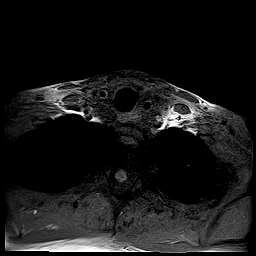
[im 4/26]
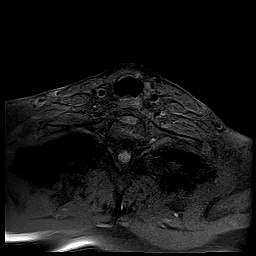
[im 7/26]
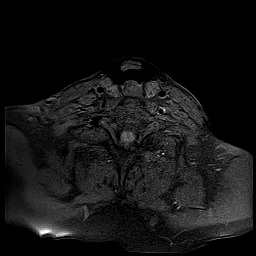
[im 10/26]
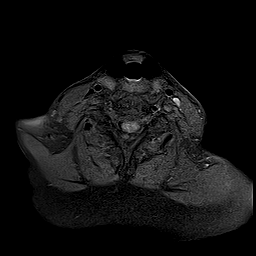
[im 16/26]
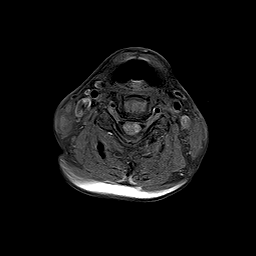
[im 19/26]
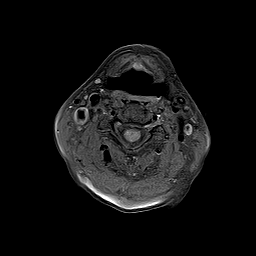
[im 22/26]
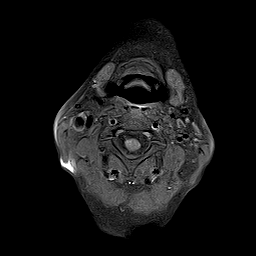
[im 26/26]
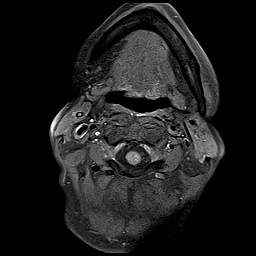

[Series 11: T1 fat-sat post-contrast · sagittal · 3.0mm · 0.75mm/px · 1 of 13 slices shown]
[im 1/13]
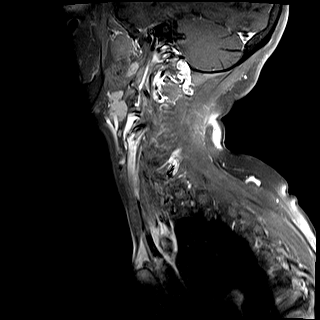

[31 of 48 positions shown; findings below may reference images not displayed]

IMPRESSION: Cervical spine myelopathy compatible with multiple sclerosis.

## 2022-12-20 ENCOUNTER — Encounter (INDEPENDENT_AMBULATORY_CARE_PROVIDER_SITE_OTHER): Payer: Self-pay | Admitting: Neurology

## 2022-12-21 ENCOUNTER — Encounter (INDEPENDENT_AMBULATORY_CARE_PROVIDER_SITE_OTHER): Payer: Self-pay | Admitting: Neurology

## 2022-12-31 ENCOUNTER — Other Ambulatory Visit: Payer: Self-pay

## 2023-01-05 ENCOUNTER — Other Ambulatory Visit: Payer: Self-pay

## 2023-01-18 ENCOUNTER — Other Ambulatory Visit: Payer: Self-pay

## 2023-02-02 ENCOUNTER — Other Ambulatory Visit: Payer: Self-pay

## 2023-02-02 ENCOUNTER — Other Ambulatory Visit: Payer: Self-pay | Admitting: GENERAL

## 2023-02-02 NOTE — Telephone Encounter (Signed)
Specialty Pharmacy Follow-up Assessment Note    Date of assessment: 02/02/2023  Patient: Kaitlin Adams is a 62 y.o. female  Diagnosis: RRMS  Specialty Medication(s): Mayzent 2 mg tablet by mouth daily  Prescriber: Ward, Melanie  Initiation of therapy: 10/09/21    Subjective:  Spoke with patient for semi-annual follow-up regarding Mayzent. Continues to take medication as prescribed; reports no missed doses and using daily routine as a method of adherence. Regarding adherence patient has 9 tablets left on hand.  Reports the following side effects: none.    Patient reports the following signs/symptoms of RRMS: some numbness, loss of balance, gait issue, muscle spasms and cramps in the legs, and left eye feel like it has sand in it.  She tells me her imbalance issue and her eye irritation is new for her. Considering current health conditions, patient scores overall health as 5/10 (0=very well, 10=very poor) which is worse since last assessment.  Patient continues to work towards current goal of preventing relapse and slowing disease progression.  Denies missed planned activities due to disease. Denies recent hospitalizations/ER/urgent care visits in the past month related to diagnosis.      Obtained updated medication list with note of the following discrepancies: no changes per patient    Objective:  Pertinent labs include:   CYP2C9*1/CYP2C9*  Quant TB neg 03/27/21  Hep B neg 03/27/21    Assessment:  Reviewed medications, conditions, and allergies with no known contraindications for continuation in therapy.  No significant drug-drug interactions expected.  Reported medication on hand matches fill records and demonstrates adherence.     Patient is working towards therapeutic goal of: disease remission.     Plan:   Continue therapy: therapy appropriate.  Patient declined medication education.    Scheduled delivery of Mayzent for 3/8. Scheduled pharmacist follow-up for six months.  Reminded patient of appointment on 4/25.   Patient expressed no additional questions and was encouraged to contact [facility] with any questions or concerns.     Kaitlin Adams, PHARMD  02/02/2023, 14:14

## 2023-02-03 ENCOUNTER — Other Ambulatory Visit: Payer: Self-pay

## 2023-02-11 ENCOUNTER — Encounter (INDEPENDENT_AMBULATORY_CARE_PROVIDER_SITE_OTHER): Payer: Self-pay | Admitting: Neurology

## 2023-02-26 ENCOUNTER — Other Ambulatory Visit: Payer: Self-pay

## 2023-03-02 ENCOUNTER — Other Ambulatory Visit: Payer: Self-pay | Admitting: GENERAL

## 2023-03-02 ENCOUNTER — Other Ambulatory Visit: Payer: Self-pay

## 2023-03-02 NOTE — Telephone Encounter (Signed)
Specialty Pharmacy Follow-up Assessment Note    Date of assessment: 03/04/2023  Patient: Kaitlin Adams is a 62 y.o. female  Diagnosis: MS  Specialty Medication(s): Mayzent 2 mg tablet once daily  Prescriber: Ward, Melanie  Initiation of therapy: 10/09/21    Spoke with patient who was transferred during refill and delivery scheduling to [facility] for concerns re: new symptoms or possible side effects with Mayzent.  Patient tells me she has a burning sensation of her skin on her left leg and arm. She states is feels like her skin is burning off.  In addition, she feels her knees are "locking up" when she is ambulating.  Patient denies new medications, changes in diet, or environmental changes (new pets/changes in soaps/detergents).    Discussed these symptoms would not be something I would expect to have a delayed onset with Mayzent as patient has been on medication for some time. Discussed symptoms of disease and flares. Patient then tells me she has been under an increased amount of stress due to caring for both her husband and aging mother. Advised that changes in stress can certainly impact disease symptoms.  Advised patient to reach out to clinic to discuss further. Patient expressed appreciation and had no additional questions and was encouraged to contact [facility] with any questions or concerns.     Vic Esco, PHARMD  03/04/2023, 10:02

## 2023-03-03 ENCOUNTER — Other Ambulatory Visit: Payer: Self-pay

## 2023-03-04 ENCOUNTER — Other Ambulatory Visit: Payer: Self-pay

## 2023-03-08 ENCOUNTER — Encounter (INDEPENDENT_AMBULATORY_CARE_PROVIDER_SITE_OTHER): Payer: Self-pay | Admitting: Nurse Practitioner

## 2023-03-25 ENCOUNTER — Encounter (INDEPENDENT_AMBULATORY_CARE_PROVIDER_SITE_OTHER): Payer: Self-pay

## 2023-03-25 ENCOUNTER — Encounter (INDEPENDENT_AMBULATORY_CARE_PROVIDER_SITE_OTHER): Payer: Medicaid Other | Admitting: Neurology

## 2023-04-02 ENCOUNTER — Encounter (INDEPENDENT_AMBULATORY_CARE_PROVIDER_SITE_OTHER): Payer: Self-pay

## 2023-04-02 ENCOUNTER — Other Ambulatory Visit (INDEPENDENT_AMBULATORY_CARE_PROVIDER_SITE_OTHER): Payer: Self-pay | Admitting: Neurology

## 2023-04-02 ENCOUNTER — Other Ambulatory Visit: Payer: Self-pay

## 2023-04-02 MED ORDER — MAYZENT 2 MG TABLET
2.0000 mg | ORAL_TABLET | Freq: Every day | ORAL | 0 refills | Status: DC
Start: 2023-04-02 — End: 2023-05-03
  Filled 2023-04-05: qty 30, 30d supply, fill #0

## 2023-04-05 ENCOUNTER — Other Ambulatory Visit: Payer: Self-pay

## 2023-04-05 NOTE — Telephone Encounter (Signed)
Final reminder follow up letter mailed. No additional refills will be provided until after patient is seen in person.  [place], PHARMD  04/05/2023, 10:04

## 2023-04-06 ENCOUNTER — Other Ambulatory Visit: Payer: Self-pay

## 2023-04-07 ENCOUNTER — Other Ambulatory Visit: Payer: Self-pay

## 2023-05-03 ENCOUNTER — Other Ambulatory Visit (INDEPENDENT_AMBULATORY_CARE_PROVIDER_SITE_OTHER): Payer: Self-pay | Admitting: Neurology

## 2023-05-03 ENCOUNTER — Other Ambulatory Visit: Payer: Self-pay

## 2023-05-03 NOTE — Telephone Encounter (Signed)
Please review and erx

## 2023-05-03 NOTE — Addendum Note (Signed)
Addended by: Chilton Si on: 05/03/2023 01:43 PM     Modules accepted: Orders

## 2023-05-05 ENCOUNTER — Other Ambulatory Visit: Payer: Self-pay

## 2023-05-05 ENCOUNTER — Other Ambulatory Visit (INDEPENDENT_AMBULATORY_CARE_PROVIDER_SITE_OTHER): Payer: Self-pay | Admitting: Neurology

## 2023-05-05 NOTE — Telephone Encounter (Signed)
-----   Message from [place], [place] sent at 05/05/2023 10:24 AM EDT -----  Regarding: RE: Ward  ----- Message from Kathie Dike sent at 05/05/2023  9:39 AM EDT -----  Kaitlin Adams is calling to see if she can get the refill on her MAYZENT 2 mg Oral Tablet     She is currently scheduled with Dr. Elesa Massed at the [facility] location July 25th. Please advise.    Preferred Pharmacy       [facility]    [address] [place] [address]    Phone: [phone] Fax: [phone]    Hours: Monday-Friday 8AM-6PM, Saturday & Sunday Closed

## 2023-05-10 ENCOUNTER — Other Ambulatory Visit: Payer: Self-pay

## 2023-05-15 ENCOUNTER — Encounter (INDEPENDENT_AMBULATORY_CARE_PROVIDER_SITE_OTHER): Payer: Self-pay | Admitting: Neurology

## 2023-05-21 ENCOUNTER — Emergency Department
Admission: EM | Admit: 2023-05-21 | Discharge: 2023-05-21 | Disposition: A | Discharge status: Home / Self Care | Payer: Medicaid Other | Attending: Family | Admitting: Family

## 2023-05-21 ENCOUNTER — Other Ambulatory Visit: Payer: Self-pay

## 2023-05-21 DIAGNOSIS — L0390 Cellulitis, unspecified: Secondary | ICD-10-CM

## 2023-05-21 DIAGNOSIS — L03221 Cellulitis of neck: Secondary | ICD-10-CM | POA: Insufficient documentation

## 2023-05-21 MED ORDER — MUPIROCIN 2 % TOPICAL OINTMENT
TOPICAL_OINTMENT | CUTANEOUS | Status: AC
Start: 2023-05-21 — End: 2023-05-21
  Filled 2023-05-21: qty 22

## 2023-05-21 MED ORDER — CLINDAMYCIN HCL 150 MG CAPSULE
ORAL_CAPSULE | ORAL | Status: AC
Start: 2023-05-21 — End: 2023-05-21
  Filled 2023-05-21: qty 2

## 2023-05-21 MED ORDER — CLINDAMYCIN HCL 150 MG CAPSULE
450.0000 mg | ORAL_CAPSULE | Freq: Three times a day (TID) | ORAL | 0 refills | Status: DC
Start: 2023-05-21 — End: 2023-06-24

## 2023-05-21 MED ORDER — BACITRACIN ZINC 500 UNIT/GRAM TOPICAL OINTMENT
TOPICAL_OINTMENT | Freq: Two times a day (BID) | CUTANEOUS | 0 refills | Status: AC | PRN
Start: 2023-05-21 — End: 2023-05-31

## 2023-05-21 MED ORDER — MUPIROCIN 2 % TOPICAL OINTMENT
TOPICAL_OINTMENT | Freq: Three times a day (TID) | CUTANEOUS | Status: DC
Start: 2023-05-21 — End: 2023-05-21

## 2023-05-21 MED ORDER — CLINDAMYCIN HCL 150 MG CAPSULE
300.0000 mg | ORAL_CAPSULE | ORAL | Status: AC
Start: 2023-05-21 — End: 2023-05-21
  Administered 2023-05-21: 300 mg via ORAL
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
## 2023-05-21 NOTE — ED Nurses Note (Signed)
Patient discharged home.  AVS reviewed with patient.  A written copy of the AVS and discharge instructions was given to the patient.  Questions sufficiently answered as needed.  Patient encouraged to follow up with PCP as indicated.  In the event of an emergency, patient instructed to call 911 or go to the nearest emergency room.
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
## 2023-05-22 ENCOUNTER — Telehealth (HOSPITAL_COMMUNITY): Payer: Self-pay | Admitting: PHYSICIAN ASSISTANT

## 2023-05-22 NOTE — Progress Notes (Signed)
05/22/23-1010-   Post Ed Follow-Up    Post ED Follow-Up:   Document completed and/or attempted interactive contact(s) after transition to home after emergency department stay.:   Transition Facility and relevant Date:   Discharge Date: 05/21/23  Discharge from [facility] Emergency Department?: Yes  Discharge Facility: [facility]  Contacted by: Lavella Lemons  Contact method: Patient/Caregiver Telephone, MyChart Patient Portal  Contact first attempt: 05/22/2023 10:09 AM  MyChart message sent?: Yes  05/22/23-1009- Lavella Lemons RN BSN Population Health Nurse Navigator

## 2023-05-23 ENCOUNTER — Emergency Department
Admission: EM | Admit: 2023-05-23 | Discharge: 2023-05-23 | Disposition: A | Discharge status: Home / Self Care | Payer: Medicaid Other

## 2023-05-23 ENCOUNTER — Other Ambulatory Visit: Payer: Self-pay

## 2023-05-23 ENCOUNTER — Encounter (HOSPITAL_COMMUNITY): Payer: Self-pay

## 2023-05-23 DIAGNOSIS — T494X1A Poisoning by keratolytics, keratoplastics, and other hair treatment drugs and preparations, accidental (unintentional), initial encounter: Secondary | ICD-10-CM

## 2023-05-23 DIAGNOSIS — L03811 Cellulitis of head [any part, except face]: Secondary | ICD-10-CM | POA: Insufficient documentation

## 2023-05-23 DIAGNOSIS — T320 Corrosions involving less than 10% of body surface: Secondary | ICD-10-CM

## 2023-05-23 DIAGNOSIS — T2047XA Corrosion of unspecified degree of neck, initial encounter: Secondary | ICD-10-CM

## 2023-05-23 DIAGNOSIS — L02811 Cutaneous abscess of head [any part, except face]: Secondary | ICD-10-CM | POA: Insufficient documentation

## 2023-05-23 LAB — COMPREHENSIVE METABOLIC PANEL, NON-FASTING
ALBUMIN/GLOBULIN RATIO: 1.4 (ref 0.8–1.4)
ALBUMIN: 4 g/dL (ref 3.5–5.7)
ALKALINE PHOSPHATASE: 73 U/L (ref 34–104)
ALT (SGPT): 18 U/L (ref 7–52)
ANION GAP: 9 mmol/L (ref 4–13)
AST (SGOT): 24 U/L (ref 13–39)
BILIRUBIN TOTAL: 0.3 mg/dL (ref 0.3–1.2)
BUN/CREA RATIO: 5 — ABNORMAL LOW (ref 6–22)
BUN: 4 mg/dL — ABNORMAL LOW (ref 7–25)
CALCIUM, CORRECTED: 9.1 mg/dL (ref 8.9–10.8)
CALCIUM: 9.1 mg/dL (ref 8.6–10.3)
CHLORIDE: 106 mmol/L (ref 98–107)
CO2 TOTAL: 24 mmol/L (ref 21–31)
CREATININE: 0.79 mg/dL (ref 0.60–1.30)
ESTIMATED GFR: 84 mL/min/{1.73_m2} (ref >59)
GLOBULIN: 2.8 — ABNORMAL LOW (ref 2.9–5.4)
GLUCOSE: 94 mg/dL (ref 74–109)
OSMOLALITY, CALCULATED: 274 mOsm/kg (ref 270–290)
POTASSIUM: 3.8 mmol/L (ref 3.5–5.1)
PROTEIN TOTAL: 6.8 g/dL (ref 6.4–8.9)
SODIUM: 139 mmol/L (ref 136–145)

## 2023-05-23 LAB — CBC WITH DIFF
BASOPHIL #: 0.1 10*3/uL (ref 0.00–0.10)
BASOPHIL %: 1 % (ref 0–1)
EOSINOPHIL #: 0.3 10*3/uL (ref 0.00–0.50)
EOSINOPHIL %: 3 %
HCT: 39.4 % (ref 31.2–41.9)
HGB: 13.2 g/dL (ref 10.9–14.3)
LYMPHOCYTE #: 1.4 10*3/uL (ref 1.00–3.00)
LYMPHOCYTE %: 11 % — ABNORMAL LOW (ref 16–44)
MCH: 30.9 pg (ref 24.7–32.8)
MCHC: 33.6 g/dL (ref 32.3–35.6)
MCV: 92 fL (ref 75.5–95.3)
MONOCYTE #: 0.8 10*3/uL (ref 0.30–1.00)
MONOCYTE %: 6 % (ref 5–13)
MPV: 8.6 fL (ref 7.9–10.8)
NEUTROPHIL #: 10.4 10*3/uL — ABNORMAL HIGH (ref 1.85–7.80)
NEUTROPHIL %: 80 % — ABNORMAL HIGH (ref 43–77)
PLATELETS: 279 10*3/uL (ref 140–440)
RBC: 4.28 10*6/uL (ref 3.63–4.92)
RDW: 13 % (ref 12.3–17.7)
WBC: 13.1 10*3/uL — ABNORMAL HIGH (ref 3.8–11.8)

## 2023-05-23 LAB — GOLD TOP TUBE

## 2023-05-23 LAB — GRAY TOP TUBE

## 2023-05-23 LAB — BLUE TOP TUBE

## 2023-05-23 MED ORDER — ONDANSETRON HCL (PF) 4 MG/2 ML INJECTION SOLUTION
4.0000 mg | INTRAMUSCULAR | Status: AC
Start: 2023-05-23 — End: 2023-05-23
  Administered 2023-05-23: 4 mg via INTRAVENOUS

## 2023-05-23 MED ORDER — SODIUM CHLORIDE 0.9 % IV BOLUS
500.0000 mL | INJECTION | Status: AC
Start: 2023-05-23 — End: 2023-05-23
  Administered 2023-05-23: 0 mL via INTRAVENOUS
  Administered 2023-05-23: 500 mL via INTRAVENOUS

## 2023-05-23 MED ORDER — LIDOCAINE HCL 10 MG/ML (1 %) INJECTION SOLUTION
INTRAMUSCULAR | Status: AC
Start: 2023-05-23 — End: 2023-05-23
  Filled 2023-05-23: qty 20

## 2023-05-23 MED ORDER — LIDOCAINE HCL 10 MG/ML (1 %) INJECTION SOLUTION
15.0000 mL | INTRAMUSCULAR | Status: AC
Start: 2023-05-23 — End: 2023-05-23
  Administered 2023-05-23: 150 mg via INTRADERMAL

## 2023-05-23 MED ORDER — ONDANSETRON HCL (PF) 4 MG/2 ML INJECTION SOLUTION
INTRAMUSCULAR | Status: AC
Start: 2023-05-23 — End: 2023-05-23
  Filled 2023-05-23: qty 2

## 2023-05-23 MED ORDER — MUPIROCIN 2 % TOPICAL OINTMENT
TOPICAL_OINTMENT | Freq: Three times a day (TID) | CUTANEOUS | Status: DC
Start: 2023-05-23 — End: 2023-05-23
  Administered 2023-05-23: 0 g via TOPICAL
  Filled 2023-05-23: qty 22

## 2023-05-23 MED ORDER — KETOROLAC 30 MG/ML (1 ML) INJECTION SOLUTION
INTRAMUSCULAR | Status: AC
Start: 2023-05-23 — End: 2023-05-23
  Filled 2023-05-23: qty 1

## 2023-05-23 MED ORDER — KETOROLAC 30 MG/ML (1 ML) INJECTION SOLUTION
15.0000 mg | INTRAMUSCULAR | Status: AC
Start: 2023-05-23 — End: 2023-05-23
  Administered 2023-05-23: 15 mg via INTRAVENOUS

## 2023-05-23 MED ORDER — LINEZOLID IN 5% DEXTROSE IN WATER 600 MG/300 ML INTRAVENOUS PIGGYBACK
600.0000 mg | INJECTION | INTRAVENOUS | Status: AC
Start: 2023-05-23 — End: 2023-05-23
  Administered 2023-05-23: 600 mg via INTRAVENOUS
  Administered 2023-05-23: 0 mg via INTRAVENOUS
  Filled 2023-05-23: qty 300
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
## 2023-05-23 NOTE — ED Nurses Note (Addendum)
Patient discharged home.  AVS reviewed with patient/care giver.  A written copy of the AVS and discharge instructions was given to the patient/care giver.  Questions sufficiently answered as needed.  Patient/care giver encouraged to follow up with PCP as indicated.  In the event of an emergency, patient/care giver instructed to call 911 or go to the nearest emergency room.

## 2023-05-23 NOTE — Discharge Instructions (Addendum)
Thank you for allowing [place] to be part of your care.    Continue taking clindamycin as prescribed previously.  Please discuss all medications with your pharmacist to ensure there are no concerns of interactions.    Please ensure all questions or concerns are addressed prior to leaving the hospital. We want to make sure your concerns are addressed to make sure you are as safe and healthy as possible. By leaving the hospital, it is understood you are in agreement with your treatment plan.    You may have received sedating medication during your visit. Please discuss this with your discharging provider nurse as you may not be able to operate machines while the medication is in your system, or while you are taking any potentially sedating prescriptions.    Please call the hospital medical records office for a copy of your finalized results, and review them with a primary care physician, for any findings needing further attention.    If you feel your situation worsens, or does not get better in 48 hours, please see a physician for evaluation.    We encourage you to see your regular doctor as soon as possible to let them know you were seen in the emergency department. They may want to do further testing. If you do not have a doctor, please feel free to call the hospital, and ask for contact information of accepting providers. Please also discuss your vaccinations, and ensure all are up to date.    You may use this document to take today off work or school.
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
## 2023-05-24 ENCOUNTER — Telehealth (HOSPITAL_COMMUNITY): Payer: Self-pay

## 2023-05-24 NOTE — Progress Notes (Signed)
Post Ed Follow-Up    Post ED Follow-Up:   Document completed and/or attempted interactive contact(s) after transition to home after emergency department stay.:   Transition Facility and relevant Date:   Discharge Date: 05/23/23  Discharge from [facility] Emergency Department?: Yes  Discharge Facility: [facility]  Contacted by: London Pepper RN  Contact method: Patient/Caregiver Telephone  Contact completed: 05/24/2023  4:01 PM  MyChart message sent?: Yes  Interventions: No needs identified       London Pepper, RN  05/24/2023 16:02

## 2023-05-26 ENCOUNTER — Other Ambulatory Visit (HOSPITAL_BASED_OUTPATIENT_CLINIC_OR_DEPARTMENT_OTHER): Payer: Self-pay | Admitting: Physician Assistant

## 2023-05-26 ENCOUNTER — Other Ambulatory Visit: Payer: Self-pay

## 2023-05-26 ENCOUNTER — Encounter (HOSPITAL_COMMUNITY): Payer: Self-pay | Admitting: Family

## 2023-05-26 ENCOUNTER — Emergency Department
Admission: EM | Admit: 2023-05-26 | Discharge: 2023-05-26 | Disposition: A | Discharge status: Home / Self Care | Payer: Medicaid Other | Attending: Family | Admitting: Family

## 2023-05-26 DIAGNOSIS — T368X6A Underdosing of other systemic antibiotics, initial encounter: Secondary | ICD-10-CM | POA: Insufficient documentation

## 2023-05-26 DIAGNOSIS — L0211 Cutaneous abscess of neck: Secondary | ICD-10-CM | POA: Insufficient documentation

## 2023-05-26 DIAGNOSIS — L0291 Cutaneous abscess, unspecified: Secondary | ICD-10-CM

## 2023-05-26 LAB — WOUND, SUPERFICIAL/NON-STERILE SITE, AEROBIC CULTURE AND GRAM STAIN

## 2023-05-26 MED ORDER — MUPIROCIN 2 % TOPICAL OINTMENT
TOPICAL_OINTMENT | Freq: Three times a day (TID) | CUTANEOUS | 0 refills | Status: DC
Start: 2023-05-26 — End: 2023-06-24

## 2023-05-26 MED ORDER — LINEZOLID 600 MG TABLET
600.0000 mg | ORAL_TABLET | Freq: Two times a day (BID) | ORAL | 0 refills | Status: AC
Start: 2023-05-26 — End: 2023-06-02

## 2023-05-26 MED ORDER — DOXYCYCLINE HYCLATE 100 MG TABLET
100.0000 mg | ORAL_TABLET | Freq: Two times a day (BID) | ORAL | 0 refills | Status: DC
Start: 2023-05-26 — End: 2023-06-24

## 2023-05-26 NOTE — ED Triage Notes (Signed)
Currently being tx for wound infection . Concern on labs rec on my chart .

## 2023-05-26 NOTE — ED Provider Notes (Signed)
[facility]  ED Primary Provider Note  History of Present Illness   Chief Complaint   Patient presents with    Wound Infection     Lashonda Sonneborn is a 62 y.o. female who had concerns including Wound Infection.  Arrival: The patient arrived by Car    Patient 62 year old female to the emergency department complaining painful "bump" to nape of neck.  Patient states she was looking at her lab results online and saw that her wound culture was positive for MRSA and came back to the emergency department.  Patient states that she was scheduled to see her primary care today but did not go.  Patient placed on clindamycin had not been taking it.  Patient has a putting mupirocin on wound.  Patient does state draining from the wound currently after I and D previously.  Patient denies fever chills myalgias.      History Reviewed This Encounter: Medical History  Surgical History  Family History  Social History    Physical Exam   ED Triage Vitals [05/26/23 1137]   BP (Non-Invasive) (!) 127/53   Heart Rate 81   Respiratory Rate 18   Temperature 37.2 C (98.9 F)   SpO2 99 %   Weight 51.7 kg (114 lb)   Height 1.615 m (5' 3.6")     Physical Exam  Vitals and nursing note reviewed.   Constitutional:       General: She is not in acute distress.     Appearance: She is well-developed.   HENT:      Head: Normocephalic and atraumatic.   Eyes:      Conjunctiva/sclera: Conjunctivae normal.   Cardiovascular:      Rate and Rhythm: Normal rate and regular rhythm.      Heart sounds: No murmur heard.  Pulmonary:      Effort: Pulmonary effort is normal. No respiratory distress.      Breath sounds: Normal breath sounds.   Abdominal:      Palpations: Abdomen is soft.      Tenderness: There is no abdominal tenderness.   Musculoskeletal:         General: No swelling.      Cervical back: Neck supple.   Skin:     General: Skin is warm and dry.      Capillary Refill: Capillary refill takes less than 2 seconds.    Neurological:      Mental Status: She is alert.   Psychiatric:         Mood and Affect: Mood normal.       Patient Data   Labs Ordered/Reviewed - No data to display  No orders to display     Medical Decision Making        Medical Decision Making  Patient 62 year old female to the emergency department complaining painful "bump" to nape of neck.  Patient states she was looking at her lab results online and saw that her wound culture was positive for MRSA and came back to the emergency department.  Patient states that she was scheduled to see her primary care today but did not go.  Patient placed on clindamycin had not been taking it.  Patient has a putting mupirocin on wound.  Patient does state draining from the wound currently after I and D previously.  Patient denies fever chills myalgias.  On physical examination patient has draining wound to back of head that is less than 1 cm in size.  Patient has  a fever chills myalgias.  Discussed with patient the importance of taking oral antibiotics.  Patient states she was not like taking the clindamycin.  Patient placed on doxycycline and the mupirocin ointment.  Patient is encouraged to follow up today with her primary care as scheduled to ensure that abscess is decreasing in size and return to the ED if fever chills myalgias or worsening of symptoms otherwise.    Risk  Prescription drug management.                   Clinical Impression   Abscess (Primary)       Disposition: Discharged

## 2023-05-26 NOTE — ED Nurses Note (Signed)
Patient discharged to home at this time. Discharge instructions provided and reviewed with patient, verbalized understanding.

## 2023-05-27 LAB — ANAEROBIC CULTURE

## 2023-05-28 LAB — ADULT ROUTINE BLOOD CULTURE, SET OF 2 BOTTLES (BACTERIA AND YEAST)
BLOOD CULTURE, ROUTINE: NO GROWTH
BLOOD CULTURE, ROUTINE: NO GROWTH

## 2023-06-11 ENCOUNTER — Other Ambulatory Visit (HOSPITAL_COMMUNITY): Payer: Self-pay | Admitting: PHYSICIAN ASSISTANT

## 2023-06-11 DIAGNOSIS — Z1231 Encounter for screening mammogram for malignant neoplasm of breast: Secondary | ICD-10-CM

## 2023-06-24 ENCOUNTER — Other Ambulatory Visit: Payer: Self-pay

## 2023-06-24 ENCOUNTER — Ambulatory Visit (INDEPENDENT_AMBULATORY_CARE_PROVIDER_SITE_OTHER): Payer: Medicaid Other | Admitting: Neurology

## 2023-06-24 ENCOUNTER — Encounter (INDEPENDENT_AMBULATORY_CARE_PROVIDER_SITE_OTHER): Payer: Self-pay | Admitting: Neurology

## 2023-06-24 VITALS — BP 126/71 | HR 66 | Temp 97.0°F | Resp 18 | Ht 63.0 in | Wt 117.2 lb

## 2023-06-24 DIAGNOSIS — Z79899 Other long term (current) drug therapy: Secondary | ICD-10-CM

## 2023-06-24 DIAGNOSIS — G35 Multiple sclerosis: Secondary | ICD-10-CM

## 2023-06-24 MED ORDER — METHOCARBAMOL 500 MG TABLET
500.0000 mg | ORAL_TABLET | Freq: Every evening | ORAL | 5 refills | Status: DC
Start: 2023-06-24 — End: 2024-06-12

## 2023-06-24 MED ORDER — LEVETIRACETAM 500 MG TABLET
1000.0000 mg | ORAL_TABLET | Freq: Two times a day (BID) | ORAL | 3 refills | Status: DC
Start: 2023-06-24 — End: 2024-05-17

## 2023-06-24 NOTE — Progress Notes (Addendum)
 [place] Department of Neurology  Multiple Sclerosis and Clinical Neuroimmunology Clinic      Date: 06/24/2023  Name: Kaitlin Adams  Age:  62 y.o.  Referring Physician:   Bluford Moats, PA-C  [address]  [place],  [place] [address]    History of Present Illness:  History obtained from:  patient    ------------------------  Disease categorization: RR vs SPMS -- *Addendum 06/06/2024: Clarifying that her current clinical course is most consistent with SPMS  Date diagnosed: 2015    Current DMT: None  Previous DMTs:  Gilenya, 2015-Dec/Jan 2022, stopped due to insurance issues; Mayzent  stopped 03/2023 due to delayed follow up  Steroid history:  None    Neurologic history:  -2015: Numbness in distal lower extremities, progressed to ankles and now to knees. Got MRI and LP. Started on Gilenya.   -Diagnosed with seizures in summer 2020 - arms contract, knees pull to her chest, shakes, jaw clenches, confused afterwards.    Pertinent studies:  -Reviewed MRI report from [facility] from 01/2021 was read as stable white matter disease compatible with multiple sclerosis compared to 08/2019. Images not available.  -Prior records from [facility] indicate that she had C/T spine lesions on prior MRI and had 10 OCBs in her CSF.  -*Most recent MRI brain/T spine (outside) read as stable, no pictures to review.    ------------------------      Kaitlin Adams is a 62 y.o. female who returns in follow up for   Chief Complaint   Patient presents with    Multiple Sclerosis      Since last visit:  -Not as good since Mayzent  off  -Lots of life stressors - has many life stressors, caretaking for husband and mom  -Back of neck was swollen 3 weeks ago - abscess, got MRSA, was on 3 different antibiotics  -Flushing, face gets hot  -Heart palpitations  -Depressed  -Rashes - was using a lot of hand sanitizer when cleaning her neck  -Cramps in her toes  -Numb in lower left leg and fingertips  -Lower back pain  -Headaches - maybe stress related, bitemporal, +  photophobia, no nausea - tries to work through it  -M.S. hugs recently  -Sometimes doesn't take amitripytline - mom wanders at night  -Prior baclofen, tizanidine , flexeril, gabapentin, Lyrica - adverse side effects      Past Medical History:   Diagnosis Date    Depression     Migraine     Multiple sclerosis (CMS HCC)     Seizure (CMS HCC)          Past Surgical History:   Procedure Laterality Date    HX APPENDECTOMY      HX CESAREAN SECTION      HX TONSILLECTOMY           Current Outpatient Medications   Medication Sig    albuterol sulfate (PROVENTIL OR VENTOLIN OR PROAIR) 90 mcg/actuation Inhalation oral inhaler Take 2 Puffs by inhalation Every 6 hours as needed    amitriptyline  (ELAVIL ) 10 mg Oral Tablet Take 1 Tablet (10 mg total) by mouth Every night    ARIPiprazole  (ABILIFY ) 2 mg Oral Tablet Take 1 Tablet (2 mg total) by mouth Once a day    azelastine (ASTELIN) 137 mcg (0.1 %) Nasal Spray, Non-Aerosol 1 Spray    budesonide-formoteroL (SYMBICORT) 160-4.5 mcg/actuation Inhalation oral inhaler Take 2 Puffs by inhalation    cetirizine (ZYRTEC) 10 mg Oral Tablet Take 1 Tablet (10 mg total) by mouth Once a  day    fluticasone propionate (FLONASE) 50 mcg/actuation Nasal Spray, Suspension Administer 2 Sprays into affected nostril(s)    hydrOXYzine  pamoate (VISTARIL ) 50 mg Oral Capsule TAKE 1 CAPSULE BY MOUTH THREE TIMES DAILY AS NEEDED    levETIRAcetam  (KEPPRA ) 500 mg Oral Tablet Take 2 Tablets (1,000 mg total) by mouth Twice daily    linaCLOtide (LINZESS) 145 mcg Oral Capsule Take 1 Capsule (145 mcg total) by mouth Every morning    LORazepam  (ATIVAN ) 1 mg Oral Tablet Take 1 Tablet (1 mg total) by mouth Three times a day    methocarbamoL  (ROBAXIN ) 500 mg Oral Tablet Take 1 Tablet (500 mg total) by mouth Every night    mirtazapine  (REMERON ) 30 mg Oral Tablet Take 1 Tablet (30 mg total) by mouth Every night    PARoxetine  (PAXIL ) 20 mg Oral Tablet Take 1 Tablet (20 mg total) by mouth Once a day    zolpidem  (AMBIEN ) 10 mg  Oral Tablet Take 1 Tablet (10 mg total) by mouth Every night as needed     Allergies   Allergen Reactions    Latex     Adhesive  Other Adverse Reaction (Add comment)     Rips skin/ band aids     Rips skin/ band aids    Ceclor [Cefaclor]     Neomycin-Polymyxin-Hc  Other Adverse Reaction (Add comment)     Other    Penicillins     Sulfa (Sulfonamides)      Family Medical History:       Problem Relation (Age of Onset)    Multiple Sclerosis Other            Social History     Socioeconomic History    Marital status: Married   Tobacco Use    Smoking status: Some Days     Types: Cigarettes     Passive exposure: Never    Smokeless tobacco: Never   Vaping Use    Vaping status: Never Used   Substance and Sexual Activity    Alcohol use: Yes     Comment: social    Drug use: Never    Sexual activity: Not Currently       Review of Systems:  Other than above, no pertinent positives.    Examination:    Vitals: BP 126/71 (Site: Left Arm, Patient Position: Sitting, Cuff Size: Adult)   Pulse 66   Temp 36.1 C (97 F) (Temporal)   Resp 18   Ht 1.6 m (5' 3)   Wt 53.2 kg (117 lb 3.2 oz)   SpO2 98%   BMI 20.76 kg/m       General Exam:  General: No acute distress.  HEENT: No scleral icterus.  Psychiatric: Affect calm.    Neurologic Exam:  Orientation: Awake, alert, appropriately oriented.  Memory: Recent and remote memory appear intact. Formal memory testing not completed today.  Attention: Normal in conversation.  Knowledge: Appropriate.  Language: Fluent with intact comprehension.  Speech: Normal.  Cranial nerves: Extraocular movements are intact. Facial sensation intact. Face activates symmetrically. Hearing intact to conversation. Shoulder shrug 5/5 bilaterally. Tongue and uvula midline.   Sensory: Decreased vibration but intact proprioception in bilateral great toes.  Muscle tone: Normal.    Muscle exam:  Arm Right Left Leg Right Left   Deltoid 5/5 5/5 Iliopsoas 5/5 5/5   Biceps 5/5 5/5 Quads 5/5 5/5   Triceps 5/5 5/5  Hamstrings 5/5 5/5   Interossei 5/5 5/5 Ankle Dorsi Flexion 5/5 5/5  Reflexes:   RJ BJ TJ KJ Plantars   Right 2+ 2+ 2+ 3+ Downgoing   Left 2+ 2+ 2+ 3+ Downgoing     Coordination: Finger-nose without dysmetria bilaterally.  Gait: Mild catch in right leg. No assistive devices.    Assessment and Plan:  Assessment/Plan   1. Relapsing remitting multiple sclerosis (CMS HCC)    2. Medication management      We discussed DMT options including 1) return to Mayzent , 2) Aubagio, 3) Mavenclad, 4) Trial off DMT. We discussed risks and benefits of all options in detail. She elected to proceed with Mavenclad. We discussed risks including lymphopenia/infection/PML/cancer concerns in initial clinical trials. She will get baseline labs.    Trial of Robaxin  for cramps/spasms - gait looks a little spastic to me also.    Refilled Keppra .    Orders Placed This Encounter    CBC/DIFF    COMPREHENSIVE METABOLIC PANEL, NON-FASTING    HIV1/HIV2 SCREEN, COMBINED ANTIGEN AND ANTIBODY    QUANTIFERON TB GOLD PLUS, BLOOD    HEPATITIS A IGM ANTIBODY    HEPATITIS B SURFACE ANTIGEN    HEPATITIS B CORE IGM, AB    HEPATITIS C ANTIBODY SCREEN WITH REFLEX TO HCV PCR    Hepatitis B Surface Antibody    methocarbamoL  (ROBAXIN ) 500 mg Oral Tablet    levETIRAcetam  (KEPPRA ) 500 mg Oral Tablet     [id number]: I will continue to be the provider focal point in managing the chronic complex neurological condition (multiple sclerosis).    Addendum 06/06/2024: Changed diagnosis above from RRMS to SPMS because SPMS fits better with her clinical course currently.    Andrea Sink, MD 06/24/2023   Assistant Professor, Department of Neurology

## 2023-06-25 ENCOUNTER — Encounter (HOSPITAL_COMMUNITY): Payer: Self-pay

## 2023-06-25 ENCOUNTER — Ambulatory Visit
Admission: RE | Admit: 2023-06-25 | Discharge: 2023-06-25 | Disposition: A | Discharge status: Home / Self Care | Payer: Medicaid Other | Source: Ambulatory Visit | Attending: PHYSICIAN ASSISTANT | Admitting: PHYSICIAN ASSISTANT

## 2023-06-25 DIAGNOSIS — Z1231 Encounter for screening mammogram for malignant neoplasm of breast: Secondary | ICD-10-CM | POA: Insufficient documentation

## 2023-07-11 ENCOUNTER — Encounter (INDEPENDENT_AMBULATORY_CARE_PROVIDER_SITE_OTHER): Payer: Self-pay | Admitting: Neurology

## 2023-07-11 DIAGNOSIS — Z5181 Encounter for therapeutic drug level monitoring: Secondary | ICD-10-CM

## 2023-07-11 DIAGNOSIS — G35 Multiple sclerosis: Secondary | ICD-10-CM

## 2023-07-13 ENCOUNTER — Encounter (INDEPENDENT_AMBULATORY_CARE_PROVIDER_SITE_OTHER): Payer: Self-pay | Admitting: Neurology

## 2023-07-21 ENCOUNTER — Encounter (INDEPENDENT_AMBULATORY_CARE_PROVIDER_SITE_OTHER): Payer: Self-pay | Admitting: Neurology

## 2023-07-21 DIAGNOSIS — Z5181 Encounter for therapeutic drug level monitoring: Secondary | ICD-10-CM

## 2023-07-21 DIAGNOSIS — G35 Multiple sclerosis: Secondary | ICD-10-CM

## 2023-07-22 ENCOUNTER — Encounter (INDEPENDENT_AMBULATORY_CARE_PROVIDER_SITE_OTHER): Payer: Self-pay | Admitting: Neurology

## 2023-07-22 NOTE — Telephone Encounter (Signed)
Referral for OCT faxed to [employer] (fax [phone]).  Maylon Cos, [place]  07/22/2023, 16:20

## 2023-08-03 ENCOUNTER — Other Ambulatory Visit: Payer: Self-pay

## 2023-08-04 ENCOUNTER — Other Ambulatory Visit: Payer: Self-pay

## 2023-08-12 ENCOUNTER — Other Ambulatory Visit: Payer: Self-pay

## 2023-08-30 ENCOUNTER — Other Ambulatory Visit: Payer: Self-pay

## 2023-09-16 ENCOUNTER — Encounter (INDEPENDENT_AMBULATORY_CARE_PROVIDER_SITE_OTHER): Payer: Self-pay

## 2023-10-14 ENCOUNTER — Encounter (HOSPITAL_COMMUNITY): Payer: Self-pay | Admitting: Family

## 2023-10-14 ENCOUNTER — Emergency Department
Admission: EM | Admit: 2023-10-14 | Discharge: 2023-10-14 | Disposition: A | Discharge status: Home / Self Care | Payer: Medicaid Other | Attending: Family | Admitting: Family

## 2023-10-14 ENCOUNTER — Emergency Department (HOSPITAL_COMMUNITY): Payer: Medicaid Other

## 2023-10-14 ENCOUNTER — Other Ambulatory Visit: Payer: Self-pay

## 2023-10-14 DIAGNOSIS — S93401A Sprain of unspecified ligament of right ankle, initial encounter: Secondary | ICD-10-CM | POA: Insufficient documentation

## 2023-10-14 DIAGNOSIS — W19XXXA Unspecified fall, initial encounter: Secondary | ICD-10-CM | POA: Insufficient documentation

## 2023-10-14 DIAGNOSIS — S93409A Sprain of unspecified ligament of unspecified ankle, initial encounter: Secondary | ICD-10-CM

## 2023-10-14 MED ORDER — NAPROXEN 500 MG TABLET
500.0000 mg | ORAL_TABLET | Freq: Two times a day (BID) | ORAL | 0 refills | Status: DC
Start: 2023-10-14 — End: 2024-08-17

## 2023-10-14 MED ORDER — KETOROLAC 30 MG/ML (1 ML) INJECTION SOLUTION
30.0000 mg | INTRAMUSCULAR | Status: AC
Start: 2023-10-14 — End: 2023-10-14
  Administered 2023-10-14: 30 mg via INTRAMUSCULAR

## 2023-10-14 MED ORDER — KETOROLAC 30 MG/ML (1 ML) INJECTION SOLUTION
INTRAMUSCULAR | Status: AC
Start: 2023-10-14 — End: 2023-10-14
  Filled 2023-10-14: qty 1

## 2023-10-14 NOTE — ED Triage Notes (Signed)
TRIPPED UP STAIRS, C/O RIGHT FOOT AND TOE PAIN. SPLINTED BY FIRE DEPT. CMS CHECKS WNL.

## 2023-10-14 NOTE — ED Nurses Note (Signed)
PRS here to take patient home in wheel chair Kaitlin Adams

## 2023-10-14 NOTE — ED Provider Notes (Signed)
[facility] - Emergency Department  ED Primary Provider Note  History of Present Illness   Chief Complaint   Patient presents with    Foot Injury     Kaitlin Adams is a 62 y.o. female who had concerns including Foot Injury.  Arrival: The patient arrived by Ambulance    Patient is a 62 year old female urgency department complaining of right ankle pain.  Patient states she fell, externally rotated her right ankle.  Patient is now complaining of pain to the foot and ankle.  Worse with ambulation better with rest.  Patient denies head strike loss of consciousness or further pain.  Patient denies use of blood thinners at home.  Patient denies numbness or tingling in her right foot for leg.      History Reviewed This Encounter: Medical History  Surgical History  Family History  Social History    Physical Exam   ED Triage Vitals [10/14/23 1208]   BP (Non-Invasive) (!) 101/59   Heart Rate 76   Respiratory Rate 16   Temperature 36.6 C (97.8 F)   SpO2 100 %   Weight 52.2 kg (115 lb)   Height 1.6 m (5\' 3" )     Physical Exam  Vitals and nursing note reviewed.   Constitutional:       General: She is not in acute distress.     Appearance: She is well-developed.   HENT:      Head: Normocephalic and atraumatic.   Eyes:      Conjunctiva/sclera: Conjunctivae normal.   Cardiovascular:      Rate and Rhythm: Normal rate and regular rhythm.      Heart sounds: No murmur heard.  Pulmonary:      Effort: Pulmonary effort is normal. No respiratory distress.      Breath sounds: Normal breath sounds.   Abdominal:      Palpations: Abdomen is soft.      Tenderness: There is no abdominal tenderness.   Musculoskeletal:         General: No swelling.      Cervical back: Neck supple.   Skin:     General: Skin is warm and dry.      Capillary Refill: Capillary refill takes less than 2 seconds.   Neurological:      Mental Status: She is alert.   Psychiatric:         Mood and Affect: Mood normal.       Patient Data   Labs  Ordered/Reviewed - No data to display  XR FOOT RIGHT   Final Result by Edi, Radresults In (11/14 1242)   NO ACUTE FRACTURE OR DISLOCATION.                Radiologist location ID: [id number]         XR ANKLE RIGHT   Final Result by Edi, Radresults In (11/14 1243)   NO ACUTE FRACTURE OR DISLOCATION.                   Radiologist location ID: [id number]           Medical Decision Making        Medical Decision Making  Patient is a 62 year old female urgency department complaining of right ankle pain.  Patient states she fell, externally rotated her right ankle.  Patient is now complaining of pain to the foot and ankle.  Worse with ambulation better with rest.  Patient denies head strike loss of consciousness or further pain.  Patient denies use of blood thinners at home.  Patient denies numbness or tingling in her right foot for leg.    On physical examination patient has mild amount of swelling to right lateral malleolus.  Full range of motion and foot noted.  DP and PT palpable.  Capillary refill less than 3 seconds.  X-rays ordered for evaluation of foot and ankle and returned within normal limits.  Patient's skin warm and dry.  X-ray showed no acute abnormalities.  Patient diagnosed with ankle sprain.  Patient placed in Ace wrap, given anti-inflammatories and walking boot as she refused crutches.  Patient follow up with primary care return to ED if worsening symptoms.    Amount and/or Complexity of Data Reviewed  Radiology: ordered.    Risk  Prescription drug management.                Medications Administered in the ED   ketorolac (TORADOL) 30 mg/mL injection (has no administration in time range)     Clinical Impression   Ankle sprain (Primary)       Disposition: Discharged

## 2023-10-14 NOTE — ED Nurses Note (Signed)
Pt evaluated and treated by medical provider while in waiting room area. No primary RN assigned; therefore, no assessment performed. All paperwork given and questions answered. Pt acknowledged all understanding. Leaving waiting area   Ace bandage applied, walking boot applied

## 2023-10-15 ENCOUNTER — Telehealth (HOSPITAL_COMMUNITY): Payer: Self-pay | Admitting: PHYSICIAN ASSISTANT

## 2023-10-15 NOTE — Telephone Encounter (Signed)
Post Ed Follow-Up    Post ED Follow-Up:   Document completed and/or attempted interactive contact(s) after transition to home after emergency department stay.:   Transition Facility and relevant Date:   Discharge Date: 10/14/23  Discharge from [facility] Emergency Department?: Yes  Discharge Facility: [facility]  Contacted by: Lum Babe, RN  Contact method: Patient/Caregiver Telephone, MyChart Patient Portal  Contact first attempt: 10/15/2023  1:43 PM  MyChart message sent?: Yes

## 2023-10-25 ENCOUNTER — Other Ambulatory Visit: Payer: Self-pay

## 2024-01-07 ENCOUNTER — Other Ambulatory Visit: Payer: Self-pay

## 2024-02-25 ENCOUNTER — Other Ambulatory Visit (HOSPITAL_COMMUNITY): Payer: Self-pay | Admitting: PHYSICIAN ASSISTANT

## 2024-02-25 ENCOUNTER — Encounter (INDEPENDENT_AMBULATORY_CARE_PROVIDER_SITE_OTHER): Payer: Self-pay | Admitting: Neurology

## 2024-02-25 DIAGNOSIS — Z1231 Encounter for screening mammogram for malignant neoplasm of breast: Secondary | ICD-10-CM

## 2024-03-01 ENCOUNTER — Ambulatory Visit
Admission: RE | Admit: 2024-03-01 | Discharge: 2024-03-01 | Disposition: A | Discharge status: Home / Self Care | Source: Ambulatory Visit | Attending: PHYSICIAN ASSISTANT | Admitting: PHYSICIAN ASSISTANT

## 2024-03-01 ENCOUNTER — Other Ambulatory Visit (HOSPITAL_COMMUNITY): Payer: Self-pay | Admitting: PHYSICIAN ASSISTANT

## 2024-03-01 ENCOUNTER — Other Ambulatory Visit: Payer: Self-pay

## 2024-03-01 ENCOUNTER — Ambulatory Visit (HOSPITAL_COMMUNITY)

## 2024-03-01 DIAGNOSIS — E559 Vitamin D deficiency, unspecified: Secondary | ICD-10-CM | POA: Insufficient documentation

## 2024-03-01 DIAGNOSIS — G35 Multiple sclerosis: Secondary | ICD-10-CM | POA: Insufficient documentation

## 2024-03-01 DIAGNOSIS — D519 Vitamin B12 deficiency anemia, unspecified: Secondary | ICD-10-CM | POA: Insufficient documentation

## 2024-03-01 LAB — VITAMIN B12: VITAMIN B 12: 133 pg/mL — ABNORMAL LOW (ref 180–914)

## 2024-03-01 LAB — COMPREHENSIVE METABOLIC PANEL, NON-FASTING
ALBUMIN/GLOBULIN RATIO: 1.6 — ABNORMAL HIGH (ref 0.8–1.4)
ALBUMIN: 4.2 g/dL (ref 3.5–5.7)
ALKALINE PHOSPHATASE: 60 U/L (ref 34–104)
ALT (SGPT): 13 U/L (ref 7–52)
ANION GAP: 8 mmol/L (ref 4–13)
AST (SGOT): 19 U/L (ref 13–39)
BILIRUBIN TOTAL: 0.3 mg/dL (ref 0.3–1.0)
BUN/CREA RATIO: 9 (ref 6–22)
BUN: 9 mg/dL (ref 7–25)
CALCIUM, CORRECTED: 9 mg/dL (ref 8.9–10.8)
CALCIUM: 9.2 mg/dL (ref 8.6–10.3)
CHLORIDE: 104 mmol/L (ref 98–107)
CO2 TOTAL: 27 mmol/L (ref 21–31)
CREATININE: 0.97 mg/dL (ref 0.60–1.30)
ESTIMATED GFR: 65 mL/min/{1.73_m2} (ref >59)
GLOBULIN: 2.6 (ref 2.0–3.5)
GLUCOSE: 102 mg/dL (ref 74–109)
OSMOLALITY, CALCULATED: 276 mosm/kg (ref 270–290)
POTASSIUM: 4.1 mmol/L (ref 3.5–5.1)
PROTEIN TOTAL: 6.8 g/dL (ref 6.4–8.9)
SODIUM: 139 mmol/L (ref 136–145)

## 2024-03-01 LAB — CBC WITH DIFF
BASOPHIL #: 0.1 10*3/uL (ref 0.00–0.10)
BASOPHIL %: 1 % (ref 0–1)
EOSINOPHIL #: 0.2 10*3/uL (ref 0.00–0.50)
EOSINOPHIL %: 2 % (ref 1–7)
HCT: 40.3 % (ref 31.2–41.9)
HGB: 13.6 g/dL (ref 10.9–14.3)
LYMPHOCYTE #: 1.6 10*3/uL (ref 1.10–3.10)
LYMPHOCYTE %: 23 % (ref 16–46)
MCH: 31.4 pg (ref 24.7–32.8)
MCHC: 33.7 g/dL (ref 32.3–35.6)
MCV: 93.2 fL (ref 75.5–95.3)
MONOCYTE #: 0.4 10*3/uL (ref 0.20–0.90)
MONOCYTE %: 6 % (ref 4–11)
MPV: 8.7 fL (ref 7.9–10.8)
NEUTROPHIL #: 4.8 10*3/uL (ref 1.90–8.20)
NEUTROPHIL %: 68 % (ref 43–77)
PLATELETS: 310 10*3/uL (ref 140–440)
RBC: 4.32 10*6/uL (ref 3.63–4.92)
RDW: 12.6 % (ref 12.3–17.7)
WBC: 7.1 10*3/uL (ref 3.8–11.8)

## 2024-03-01 LAB — VITAMIN D 25 TOTAL: VITAMIN D 25, TOTAL: 21.2 ng/mL — ABNORMAL LOW (ref 30.00–100.00)

## 2024-03-03 ENCOUNTER — Encounter (INDEPENDENT_AMBULATORY_CARE_PROVIDER_SITE_OTHER): Payer: Self-pay | Admitting: Neurology

## 2024-03-03 DIAGNOSIS — G35 Multiple sclerosis: Secondary | ICD-10-CM

## 2024-03-03 LAB — ECG 12 LEAD
Atrial Rate: 65 {beats}/min
Calculated P Axis: 77 degrees
Calculated R Axis: 77 degrees
Calculated T Axis: 76 degrees
PR Interval: 146 ms
QRS Duration: 84 ms
QT Interval: 408 ms
QTC Calculation: 424 ms
Ventricular rate: 65 {beats}/min

## 2024-03-12 ENCOUNTER — Encounter (INDEPENDENT_AMBULATORY_CARE_PROVIDER_SITE_OTHER): Payer: Self-pay | Admitting: Neurology

## 2024-04-12 ENCOUNTER — Encounter (INDEPENDENT_AMBULATORY_CARE_PROVIDER_SITE_OTHER): Payer: Self-pay | Admitting: Neurology

## 2024-05-09 ENCOUNTER — Other Ambulatory Visit: Payer: Self-pay

## 2024-05-09 ENCOUNTER — Emergency Department
Admission: EM | Admit: 2024-05-09 | Discharge: 2024-05-09 | Disposition: A | Discharge status: Home / Self Care | Attending: Physician Assistant | Admitting: Physician Assistant

## 2024-05-09 DIAGNOSIS — L02411 Cutaneous abscess of right axilla: Secondary | ICD-10-CM | POA: Insufficient documentation

## 2024-05-09 DIAGNOSIS — L02419 Cutaneous abscess of limb, unspecified: Secondary | ICD-10-CM

## 2024-05-09 MED ORDER — CLINDAMYCIN HCL 300 MG CAPSULE
300.0000 mg | ORAL_CAPSULE | Freq: Four times a day (QID) | ORAL | 0 refills | Status: AC
Start: 2024-05-09 — End: 2024-05-16

## 2024-05-09 NOTE — ED Triage Notes (Signed)
 Patient arrives to triage stating that she has pain in the right arm pit area. Describes a red bump that hurts onset was just this morning. States she had pain yesterday but didn't notice the raised area until today and is now having pain with movement

## 2024-05-09 NOTE — ED Provider Notes (Signed)
 Emergency Medicine      Name: Kaitlin Adams  Age and Gender: 63 y.o. female  Date of Birth: 12-06-1960  MRN: [id number]  PCP: Wendell Halt, PA-C    CC:  Chief Complaint   Patient presents with    Skin Problem       HPI:  Kaitlin Adams is a 63 y.o. White female who presents to the ER with right axillary skin problem. Patient states she started having some pain in the right axilla yesterday but this morning noticed a red bump in the area. She reports pain with movement of the right arm.    Below pertinent information reviewed with patient:  Past Medical History:   Diagnosis Date    Depression     Migraine     Multiple sclerosis (CMS HCC)     Seizure (CMS HCC)            Allergies[1]    Past Surgical History:   Procedure Laterality Date    HX APPENDECTOMY      HX CESAREAN SECTION      HX TONSILLECTOMY          Social History     Socioeconomic History    Marital status: Married   Tobacco Use    Smoking status: Some Days     Types: Cigarettes     Passive exposure: Never    Smokeless tobacco: Never   Vaping Use    Vaping status: Never Used   Substance and Sexual Activity    Alcohol use: Yes     Comment: social    Drug use: Never    Sexual activity: Not Currently       ROS:  No other overt positive review of systems are noted other than stated in the HPI.      Objective:    ED Triage Vitals [05/09/24 1012]   BP (Non-Invasive) 95/67   Heart Rate 84   Respiratory Rate 18   Temperature 36.4 C (97.6 F)   SpO2 98 %   Weight 53.1 kg (117 lb)   Height 1.6 m (5' 3)     Filed Vitals:    05/09/24 1012   BP: 95/67   Pulse: 84   Resp: 18   Temp: 36.4 C (97.6 F)   SpO2: 98%       Nursing notes and vital signs reviewed.    Constitutional - No acute distress.  Alert and Active.  HEENT - Normocephalic. Conjunctiva clear. Moist mucous membranes.   Neck - Trachea midline. No stridor. No hoarseness.  Respiratory/Chest - Normal respiratory effort.  Musculoskeletal - Good AROM.  No clubbing, cyanosis or edema.  Skin - Warm and dry. 1cm  indurated erythematous early abscess of the right axilla.   Neuro - Alert and oriented x 3. Moving all extremities symmetrically.   Psych - Normal mood and affect. Behavior is normal         Any pertinent labs and imaging obtained during this encounter reviewed below in MDM.    MDM/ED Course:      Medical Decision Making  Patient presents to the ER with right axillary skin problem. Patient states she started having some pain in the right axilla yesterday but this morning noticed a red bump in the area. She reports pain with movement of the right arm.    Differential diagnoses include but are not limited to: cellulitis, abscess, lymphadenopathy    No diagnostics were felt to be indicated at this time  Patient was found/suspected to have early abscess of the right axilla.  Patient will be placed on clindamycin  and instructed to use warm moist compresses.  Return precautions were given    Patient will be discharged in stable condition at this time.    Risk  Prescription drug management.               Orders Placed This Encounter    clindamycin  (CLEOCIN ) 300 mg Oral Capsule         Impression:   Clinical Impression   Abscess of axilla (Primary)       Disposition: Discharged    / Rob Chihuahua PA-C  05/09/2024, 10:51  [facility], [place] Departments of Emergency Medicine  [place]  [place]    Portions of this note may have been dictated using voice recognition software.     -----------------------  No results found for this or any previous visit (from the past 12 hours).  No orders to display            [1]   Allergies  Allergen Reactions    Latex     Adhesive  Other Adverse Reaction (Add comment)     Rips skin/ band aids     Rips skin/ band aids    Ceclor [Cefaclor]     Neomycin-Polymyxin-Hc  Other Adverse Reaction (Add comment)     Other    Penicillins     Sulfa (Sulfonamides)

## 2024-05-09 NOTE — Discharge Instructions (Signed)
 Take clindamycin  as prescribed until gone.  Apply warm moist compresses to the affected area.  Return to the ED for any new or worsening symptoms or as needed.

## 2024-05-09 NOTE — ED Nurses Note (Signed)
 Discharged by provider

## 2024-05-10 ENCOUNTER — Encounter (INDEPENDENT_AMBULATORY_CARE_PROVIDER_SITE_OTHER): Payer: Self-pay | Admitting: Neurology

## 2024-05-17 ENCOUNTER — Other Ambulatory Visit (INDEPENDENT_AMBULATORY_CARE_PROVIDER_SITE_OTHER): Payer: Self-pay | Admitting: Neurology

## 2024-05-17 MED ORDER — LEVETIRACETAM 500 MG TABLET: 1000.0000 mg | ORAL_TABLET | Freq: Two times a day (BID) | ORAL | 1 refills | Status: DC

## 2024-05-23 ENCOUNTER — Other Ambulatory Visit: Payer: Self-pay

## 2024-05-23 ENCOUNTER — Telehealth (INDEPENDENT_AMBULATORY_CARE_PROVIDER_SITE_OTHER): Payer: Self-pay

## 2024-05-23 DIAGNOSIS — G35 Multiple sclerosis: Secondary | ICD-10-CM

## 2024-05-23 MED ORDER — MAYZENT 2 MG TABLET
2.0000 mg | ORAL_TABLET | Freq: Every day | ORAL | 5 refills | Status: AC
Start: 2024-05-23 — End: ?
  Filled 2024-05-23 – 2024-06-23 (×2): qty 30, 30d supply, fill #0
  Filled 2024-08-01 – 2024-08-21 (×2): qty 30, 30d supply, fill #1

## 2024-05-23 MED ORDER — MAYZENT STARTER PACK (FOR 2 MG MAINT DOSE) 0.25 MG (12 TABS) TABLETS
ORAL_TABLET | ORAL | 0 refills | Status: DC
Start: 2024-05-23 — End: 2024-08-01
  Filled 2024-05-23 – 2024-06-15 (×3): qty 12, 5d supply, fill #0

## 2024-05-23 NOTE — Telephone Encounter (Signed)
 Baseline Assessment for Mayzent  restart    Previously on Mayzent  from 09/2021 to 03/2023. Discontinued after delay in follow up.     OCT complete 05/22/24, scanned into media.     Labs:       Latest Reference Range & Units 03/01/24 09:16   WBC 3.8 - 11.8 x10^3/uL 7.1   HGB 10.9 - 14.3 g/dL 86.3   HCT 68.7 - 58.0 % 40.3   PLATELET COUNT 140 - 440 x10^3/uL 310   RBC 3.63 - 4.92 x10^6/uL 4.32   MCV 75.5 - 95.3 fL 93.2   MCHC 32.3 - 35.6 g/dL 66.2   MCH 75.2 - 67.1 pg 31.4   RDW 12.3 - 17.7 % 12.6   MPV 7.9 - 10.8 fL 8.7   PMN'S 43 - 77 % 68   LYMPHOCYTES 16 - 46 % 23   EOSINOPHIL 1 - 7 % 2   MONOCYTES 4 - 11 % 6   BASOPHILS 0 - 1 % 1   PMN ABS 1.90 - 8.20 x10^3/uL 4.80   LYMPHS ABS 1.10 - 3.10 x10^3/uL 1.60   EOS ABS 0.00 - 0.50 x10^3/uL 0.20   MONOS ABS 0.20 - 0.90 x10^3/uL 0.40   BASOS ABS 0.00 - 0.10 x10^3/uL 0.10   SODIUM 136 - 145 mmol/L 139   POTASSIUM 3.5 - 5.1 mmol/L 4.1   CHLORIDE 98 - 107 mmol/L 104   CARBON DIOXIDE 21 - 31 mmol/L 27   BUN 7 - 25 mg/dL 9   CREATININE 9.39 - 8.69 mg/dL 9.02   GLUCOSE 74 - 890 mg/dL 897   ANION GAP 4 - 13 mmol/L 8   BUN/CREAT RATIO 6 - 22  9   ESTIMATED GLOMERULAR FILTRATION RATE >59 mL/min/1.71m^2 65   CALCIUM 8.6 - 10.3 mg/dL 9.2   CALCIUM, CORRECTED 8.9 - 10.8 mg/dL 9.0   GLOBULIN, TOTAL 2.0 - 3.5  2.6   OSMOLALITY, CALCULATED 270 - 290 mOsm/kg 276   TOTAL PROTEIN 6.4 - 8.9 g/dL 6.8   ALBUMIN 3.5 - 5.7 g/dL 4.2   BILIRUBIN, TOTAL 0.3 - 1.0 mg/dL 0.3   AST (SGOT) 13 - 39 U/L 19   ALT (SGPT) 7 - 52 U/L 13   ALKALINE PHOSPHATASE 34 - 104 U/L 60     03/27/21                    ECG:          Will be due for follow up within the next few months.   Nevelyn Roof, [place]  05/23/2024, 14:23

## 2024-05-23 NOTE — Telephone Encounter (Signed)
 Prior authorization for Mayzent  submitted electronically on 05/23/2024 through [facility] Portal. Key [id number] . Waiting for response from payor.    Vernell Shams, Pharmacy Technician

## 2024-05-24 ENCOUNTER — Other Ambulatory Visit: Payer: Self-pay

## 2024-05-24 NOTE — Telephone Encounter (Signed)
 Prior Authorization for medication Mayzent  has been denied on date 05/24/24.  Payor RDT lists the following reasons for denial: see below. Denial notice has been scanned into Epic and can be found in the Media tab. This signed encounter has been routed to [facility] noting the determination of prior authorization and informing that further action is needed from the clinic.    Vernell Shams, Pharmacy Technician

## 2024-06-09 ENCOUNTER — Encounter (INDEPENDENT_AMBULATORY_CARE_PROVIDER_SITE_OTHER): Payer: Self-pay

## 2024-06-12 ENCOUNTER — Other Ambulatory Visit: Payer: Self-pay

## 2024-06-12 ENCOUNTER — Ambulatory Visit: Attending: Family | Admitting: Family

## 2024-06-12 DIAGNOSIS — G8929 Other chronic pain: Secondary | ICD-10-CM

## 2024-06-12 DIAGNOSIS — Z5181 Encounter for therapeutic drug level monitoring: Secondary | ICD-10-CM

## 2024-06-12 DIAGNOSIS — R519 Headache, unspecified: Secondary | ICD-10-CM

## 2024-06-12 DIAGNOSIS — R252 Cramp and spasm: Secondary | ICD-10-CM

## 2024-06-12 DIAGNOSIS — G35 Multiple sclerosis: Secondary | ICD-10-CM

## 2024-06-12 MED ORDER — METHOCARBAMOL 500 MG TABLET
500.0000 mg | ORAL_TABLET | Freq: Every evening | ORAL | 5 refills | Status: DC
Start: 2024-06-12 — End: 2024-08-06

## 2024-06-12 NOTE — Progress Notes (Signed)
 [facility] Department of Neurology  Multiple Sclerosis and Clinical Neuroimmunology Clinic      Date: 06/12/2024  Name: Kaitlin Adams  Age:  64 y.o.  Referring Physician:   No referring provider defined for this encounter.    History of Present Illness:  History obtained from:  patient    ------------------------  Disease categorization: SPMS   Date diagnosed: 2015    Current DMT: None  Previous DMTs:  Gilenya, 2015-Dec/Jan 2022, stopped due to insurance issues; Mayzent  stopped 03/2023 due to delayed follow up  Steroid history:  None    Neurologic history:  -2015: Numbness in distal lower extremities, progressed to ankles and now to knees. Got MRI and LP. Started on Gilenya.   -Diagnosed with seizures in summer 2020 - arms contract, knees pull to her chest, shakes, jaw clenches, confused afterwards.    Pertinent studies:  -Reviewed MRI report from [place] from 01/2021 was read as stable white matter disease compatible with multiple sclerosis compared to 08/2019. Images not available.  -Prior records from [facility] indicate that she had C/T spine lesions on prior MRI and had 10 OCBs in her CSF.  -*Most recent MRI brain/T spine (outside) read as stable, no pictures to review.    ------------------------      Kaitlin Adams is a 63 y.o. female who returns in follow up for   No chief complaint on file.     Since last visit:  - hasn't been on meds in 2 + years   - discussed with Dr. Neomi- wants to go back on mayzent    - OCT was good. Dr. Laurier  - knee lock up. Having difficulty going up and down stairs   - frequent headaches  - husband passed July 2025 (funeral was on Saturday)  - last 2 years she has had a ill husband, loss of dad and sister  - has ankle spams at time. Currently not on medications.     -Prior baclofen, tizanidine , flexeril, gabapentin, Lyrica - adverse side effects      Past Medical History:   Diagnosis Date    Depression     Migraine     Multiple sclerosis (CMS HCC)     Seizure (CMS HCC)          Past Surgical  History:   Procedure Laterality Date    HX APPENDECTOMY      HX CESAREAN SECTION      HX TONSILLECTOMY           Current Outpatient Medications   Medication Sig    albuterol sulfate (PROVENTIL OR VENTOLIN OR PROAIR) 90 mcg/actuation Inhalation oral inhaler Take 2 Puffs by inhalation Every 6 hours as needed    azelastine (ASTELIN) 137 mcg (0.1 %) Nasal Spray, Non-Aerosol 1 Spray    fluticasone propionate (FLONASE) 50 mcg/actuation Nasal Spray, Suspension Administer 2 Sprays into affected nostril(s)    hydrOXYzine  pamoate (VISTARIL ) 50 mg Oral Capsule TAKE 1 CAPSULE BY MOUTH THREE TIMES DAILY AS NEEDED    levETIRAcetam  (KEPPRA ) 500 mg Oral Tablet Take 2 Tablets (1,000 mg total) by mouth Twice daily    linaCLOtide (LINZESS) 145 mcg Oral Capsule Take 1 Capsule (145 mcg total) by mouth Every morning    LORazepam  (ATIVAN ) 1 mg Oral Tablet Take 1 Tablet (1 mg total) by mouth Three times a day    methocarbamoL  (ROBAXIN ) 500 mg Oral Tablet Take 1 Tablet (500 mg total) by mouth Every night    naproxen  (NAPROSYN ) 500 mg Oral Tablet Take  1 Tablet (500 mg total) by mouth Twice daily with food    PARoxetine  (PAXIL ) 20 mg Oral Tablet Take 1 Tablet (20 mg total) by mouth Once a day    siponimod  (MAYZENT  STARTER,FOR 2MG  MAINT,) 0.25 mg (12 tabs) Oral Tablets, Dose Pack Take 1 tablet (0.25 mg) by mouth on days 1 and 2, then 2 tablets on day 3, then 3 tablets on day 4, and then 5 tablets on day 5    siponimod  (MAYZENT ) 2 mg Oral Tablet Take 1 Tablet (2 mg total) by mouth Daily    zolpidem  (AMBIEN ) 10 mg Oral Tablet Take 1 Tablet (10 mg total) by mouth Every night as needed     Allergies   Allergen Reactions    Latex     Adhesive  Other Adverse Reaction (Add comment)     Rips skin/ band aids     Rips skin/ band aids    Ceclor [Cefaclor]     Neomycin-Polymyxin-Hc  Other Adverse Reaction (Add comment)     Other    Penicillins     Sulfa (Sulfonamides)      Family Medical History:       Problem Relation (Age of Onset)    Multiple Sclerosis  Other    No Known Problems Mother, Father, Sister, Brother, Maternal Grandmother, Maternal Grandfather, Paternal Grandmother, Paternal Grandfather, Daughter, Son, Maternal Aunt, Maternal Uncle, Paternal Aunt, Paternal Uncle            Social History     Socioeconomic History    Marital status: Married   Tobacco Use    Smoking status: Some Days     Types: Cigarettes     Passive exposure: Never    Smokeless tobacco: Never   Vaping Use    Vaping status: Never Used   Substance and Sexual Activity    Alcohol use: Yes     Comment: social    Drug use: Never    Sexual activity: Not Currently       Review of Systems:  Other than above, no pertinent positives.    Examination:    No exam tele visit       Assessment and Plan:  No diagnosis found.    Orders Placed This Encounter    methocarbamoL  (ROBAXIN ) 500 mg Oral Tablet       63 y.o. female with SPMS  - needs visit for approval of mayzent    - restart robaxin   - influx of symptoms likely insetting of recent loss of her husband  - monitor headache- consider restarting elavil    - continue Keppra     Geni Ponciano Browner, APRN,FNP-BC    TELEMEDICINE DOCUMENTATION:    Patient Location:  MyChart video visit from home address: [address]  [place] [address]    Patient/family aware of provider location:  yes  Patient/family consent for telemedicine:  yes  Examination observed and performed by:  Geni Ponciano Browner, APRN,FNP-BC

## 2024-06-13 ENCOUNTER — Other Ambulatory Visit: Payer: Self-pay

## 2024-06-13 NOTE — Telephone Encounter (Signed)
 Prior authorization for Mayzent  submitted electronically on 06/13/2024 through [facility]. Key [id number]. Waiting for response from payor.    Vernell Shams, Pharmacy Technician

## 2024-06-14 ENCOUNTER — Other Ambulatory Visit: Payer: Self-pay

## 2024-06-14 NOTE — Telephone Encounter (Signed)
 Faxed additional info.    Lindalou Hose, Pharmacy Technician

## 2024-06-15 ENCOUNTER — Other Ambulatory Visit: Payer: Self-pay

## 2024-06-15 ENCOUNTER — Encounter (INDEPENDENT_AMBULATORY_CARE_PROVIDER_SITE_OTHER): Payer: Self-pay | Admitting: Neurology

## 2024-06-15 NOTE — Telephone Encounter (Signed)
 Specialty Pharmacy Note    Prior Authorization for medication Mayzent  has been approved by payor RDT until 06/12/25.  Approval notice has been scanned into Epic and can be found in the Media tab. Per the Patient's Choice the prescription will be sent to [facility] pharmacy.     No further action is needed from clinic      If you have any questions, don't hesitate to contact the Specialty Pharmacy Vernell Shams, Pharmacy Technician

## 2024-06-19 DIAGNOSIS — G35 Multiple sclerosis: Secondary | ICD-10-CM

## 2024-06-22 ENCOUNTER — Other Ambulatory Visit: Payer: Self-pay

## 2024-06-23 ENCOUNTER — Other Ambulatory Visit: Payer: Self-pay

## 2024-06-23 ENCOUNTER — Other Ambulatory Visit: Payer: Self-pay | Admitting: Pharmacist

## 2024-06-23 NOTE — Telephone Encounter (Signed)
 Specialty Pharmacy Initial Note    Date of assessment: 06/23/2024  Patient: Kaitlin Adams is a 63 y.o. female  Diagnosis: SPMS  Specialty Medication: Mayzent  starter pack: Take 1 tablet (0.25 mg) by mouth on days 1 and 2, then 2 tablets on day 3, then 3 tablets on day 4, and then 5 tablets on day 5  Maintenance: Mayzent  2 mg tablet Take 1 Tablet (2 mg total) by mouth Daily  Prescriber: Neomi Hollering, MD  Anticipated start date: 06/28/24     Subjective:  Spoke with Kaitlin Adams regarding initial Mayzent  education. She has previously been on therapy but was lost to follow-up.    Disease state: Multiple Sclerosis:  Symptom report: hot flashes, toes cramping, left index finger cramping/numb, ankle and leg spasms, knee locking, and weakness in her arms. She lists her only known symptom trigger as heat.  Most burdensome symptom cluster: Pain, spasticity  How confident are you in taking this medication? very confident  How confident are you in managing the side effects of this medication? very confident  In the past 6 months, have you experienced an MS-related relapse: No  Patient reported goal includes: stop leg spasms, knee locking up, and arm weakness; just want to feel better    Side effects: no side effects when taking previously; knows to monitor blood pressure when restarting    Adherence: reviewed     Medication list reported changes: Ativan  is prn, states she is also taking OTC B12 and vitamin D  but is unsure of the strength     Objective:  Pertinent labs and monitoring parameters include:    05/09/24 BP 95/67  03/01/24 lymph 1.60, ALT 13, AST 19, bilirubin 0.3  03/27/21 CYP2C9 *1/*1 (NM), Quant TB neg, Hep B neg, VZV Ab pos  MRI 01/2021 brain and spinal lesions present; outside imaging has since been reviewed by provider (no dates listed); stable  EKG normal 09/25/21      Assessment/Plan:  Disease state: medication appropriate to initiate with therapeutic goal of: disease remission. Reviewed medication purpose,  administration, storage/handling, when to contact the pharmacy, when to contact the clinic, when to seek appropriate care, and disease state.  Side effects: declined, no side effects in the past  Adherence: Reminded patient of the importance of adherence.  Medication list: reviewed with patient; up-to-date as of today  Notable drug-interactions or contraindications: none  Reminded that follow-up appointment with neurology needs scheduled.  Scheduled delivery of Mayzent  for Wednesday 7/30. Scheduled pharmacist follow up for next refill or sooner as needed.     Patient expressed no additional questions and was encouraged to contact [facility] with any questions or concerns.     Milana Rummer, [facility] 06/23/2024, 13:02

## 2024-06-26 ENCOUNTER — Other Ambulatory Visit: Payer: Self-pay

## 2024-06-27 ENCOUNTER — Other Ambulatory Visit: Payer: Self-pay

## 2024-06-29 ENCOUNTER — Other Ambulatory Visit: Payer: Self-pay

## 2024-06-30 ENCOUNTER — Other Ambulatory Visit: Payer: Self-pay

## 2024-07-01 ENCOUNTER — Other Ambulatory Visit: Payer: Self-pay

## 2024-07-02 ENCOUNTER — Other Ambulatory Visit: Payer: Self-pay

## 2024-07-03 ENCOUNTER — Other Ambulatory Visit: Payer: Self-pay

## 2024-07-03 ENCOUNTER — Ambulatory Visit (HOSPITAL_COMMUNITY): Payer: Self-pay

## 2024-07-11 ENCOUNTER — Emergency Department
Admission: EM | Admit: 2024-07-11 | Discharge: 2024-07-11 | Disposition: A | Discharge status: Home / Self Care | Attending: Physician Assistant | Admitting: Physician Assistant

## 2024-07-11 ENCOUNTER — Other Ambulatory Visit: Payer: Self-pay

## 2024-07-11 ENCOUNTER — Emergency Department (HOSPITAL_COMMUNITY)

## 2024-07-11 DIAGNOSIS — J340 Abscess, furuncle and carbuncle of nose: Secondary | ICD-10-CM | POA: Insufficient documentation

## 2024-07-11 DIAGNOSIS — L539 Erythematous condition, unspecified: Secondary | ICD-10-CM

## 2024-07-11 DIAGNOSIS — W44H0XA Other sharp object unspecified, entering into or through a natural orifice, initial encounter: Secondary | ICD-10-CM

## 2024-07-11 DIAGNOSIS — R109 Unspecified abdominal pain: Secondary | ICD-10-CM

## 2024-07-11 DIAGNOSIS — T182XXA Foreign body in stomach, initial encounter: Secondary | ICD-10-CM

## 2024-07-11 LAB — CBC WITH DIFF
BASOPHIL #: 0 x10ˆ3/uL (ref 0.00–0.10)
BASOPHIL %: 1 % (ref 0–1)
EOSINOPHIL #: 0.2 x10ˆ3/uL (ref 0.00–0.50)
EOSINOPHIL %: 3 % (ref 1–7)
HCT: 41.2 % (ref 31.2–41.9)
HGB: 13.9 g/dL (ref 10.9–14.3)
LYMPHOCYTE #: 0.7 x10ˆ3/uL — ABNORMAL LOW (ref 1.10–3.10)
LYMPHOCYTE %: 9 % — ABNORMAL LOW (ref 16–46)
MCH: 30.5 pg (ref 24.7–32.8)
MCHC: 33.7 g/dL (ref 32.3–35.6)
MCV: 90.3 fL (ref 75.5–95.3)
MONOCYTE #: 0.9 x10ˆ3/uL (ref 0.20–0.90)
MONOCYTE %: 12 % — ABNORMAL HIGH (ref 4–11)
MPV: 7.9 fL (ref 7.9–10.8)
NEUTROPHIL #: 5.7 x10ˆ3/uL (ref 1.90–8.20)
NEUTROPHIL %: 75 % (ref 43–77)
PLATELETS: 287 x10ˆ3/uL (ref 140–440)
RBC: 4.56 x10ˆ6/uL (ref 3.63–4.92)
RDW: 13.2 % (ref 12.3–17.7)
WBC: 7.5 x10ˆ3/uL (ref 3.8–11.8)

## 2024-07-11 LAB — COMPREHENSIVE METABOLIC PANEL, NON-FASTING
ALBUMIN/GLOBULIN RATIO: 1.5 — ABNORMAL HIGH (ref 0.8–1.4)
ALBUMIN: 4 g/dL (ref 3.5–5.7)
ALKALINE PHOSPHATASE: 72 U/L (ref 34–104)
ALT (SGPT): 13 U/L (ref 7–52)
ANION GAP: 7 mmol/L (ref 4–13)
AST (SGOT): 16 U/L (ref 13–39)
BILIRUBIN TOTAL: 0.3 mg/dL (ref 0.3–1.0)
BUN/CREA RATIO: 8 (ref 6–22)
BUN: 7 mg/dL (ref 7–25)
CALCIUM, CORRECTED: 8.9 mg/dL (ref 8.9–10.8)
CALCIUM: 8.9 mg/dL (ref 8.6–10.3)
CHLORIDE: 103 mmol/L (ref 98–107)
CO2 TOTAL: 25 mmol/L (ref 21–31)
CREATININE: 0.92 mg/dL (ref 0.60–1.30)
ESTIMATED GFR: 70 mL/min/1.73mˆ2 (ref >59)
GLOBULIN: 2.6 (ref 2.0–3.5)
GLUCOSE: 102 mg/dL (ref 74–109)
OSMOLALITY, CALCULATED: 268 mosm/kg — ABNORMAL LOW (ref 270–290)
POTASSIUM: 4 mmol/L (ref 3.5–5.1)
PROTEIN TOTAL: 6.6 g/dL (ref 6.4–8.9)
SODIUM: 135 mmol/L — ABNORMAL LOW (ref 136–145)

## 2024-07-11 LAB — PT/INR
INR: 0.87 (ref 0.84–1.10)
PROTHROMBIN TIME: 9.9 s (ref 9.8–12.7)

## 2024-07-11 LAB — PTT (PARTIAL THROMBOPLASTIN TIME): APTT: 38.4 s — ABNORMAL HIGH (ref 25.0–38.0)

## 2024-07-11 MED ORDER — MUPIROCIN 2 % TOPICAL OINTMENT
TOPICAL_OINTMENT | Freq: Three times a day (TID) | CUTANEOUS | 1 refills | Status: DC
Start: 2024-07-11 — End: 2024-08-17

## 2024-07-11 MED ORDER — CLINDAMYCIN HCL 150 MG CAPSULE
450.0000 mg | ORAL_CAPSULE | Freq: Three times a day (TID) | ORAL | 0 refills | Status: DC
Start: 2024-07-11 — End: 2024-08-17

## 2024-07-11 MED ORDER — POTASSIUM CHLORIDE 20 MEQ/L IN 0.9 % SODIUM CHLORIDE INTRAVENOUS
INTRAVENOUS | Status: AC
Start: 2024-07-11 — End: 2024-07-11
  Filled 2024-07-11: qty 1000

## 2024-07-11 NOTE — Discharge Instructions (Signed)
 A prescription for antibiotics was written take this as prescribed also a prescription for a antibiotic ointment was written use this as directed.  You can take Tylenol  or Motrin for any fever or discomfort.  Follow up with the primary care to make sure getting better.  If you have new or worsening symptoms return to the emergency department

## 2024-07-11 NOTE — ED Triage Notes (Signed)
 Pt reports that she swallowed 2 staples when taking medications because she forgot that she had put them in an open water  can. Pt also c/o general malaise and redness swelling to tip of nose. Hx MS

## 2024-07-11 NOTE — ED Nurses Note (Signed)
 Patient discharged at this time. Written AVS provided, patient denies any further issues. Patient was not assessed by this nurse during her visit as I was not made primary nurse until 1915. Patient ambulated independently out of ED at this time.

## 2024-07-11 NOTE — ED Provider Notes (Signed)
 [facility] - Emergency Department  ED Primary Note  History of Present Illness   Kaitlin Adams is a 63 y.o. female who had concerns including Vomiting and Foreign Body Swallowed.     Patient is a 63 year old female past medical history of multiple sclerosis seizure disorder migraines presents emergency department with complaints of foreign body ingestion and no swelling.  She states she left a couple of staples in somewhat empty can earlier was cleaning went to take her medicine and swallowed the staples she believes she swallowed 2 denies abdominal pain.  She states since yesterday she has had some redness and swelling as well as tenderness of her nose she localizes primarily to the left nares denies discharge.  She reports some weakness denies fevers chills nausea vomiting    Physical Exam   ED Triage Vitals [07/11/24 1647]   BP (Non-Invasive) 132/79   Heart Rate 91   Respiratory Rate 16   Temperature 36.3 C (97.3 F)   SpO2 96 %   Weight 52.2 kg (115 lb)   Height 1.6 m (5' 3)     Physical Exam  Constitutional:       Appearance: Normal appearance.   HENT:      Head: Normocephalic.      Nose:      Comments: Erythema over the tip of the nose slightly worse in the left than the right no obvious pustule within the nare     Mouth/Throat:      Mouth: Mucous membranes are moist.   Eyes:      Pupils: Pupils are equal, round, and reactive to light.   Cardiovascular:      Rate and Rhythm: Normal rate and regular rhythm.      Pulses: Normal pulses.      Heart sounds: Normal heart sounds.   Pulmonary:      Effort: Pulmonary effort is normal.      Breath sounds: Normal breath sounds.   Abdominal:      General: Abdomen is flat. Bowel sounds are normal. There is no distension.      Palpations: Abdomen is soft.      Tenderness: There is no abdominal tenderness.   Musculoskeletal:      Cervical back: Normal range of motion.   Neurological:      Mental Status: She is alert.       Patient Data   Labs  Ordered/Reviewed   COMPREHENSIVE METABOLIC PANEL, NON-FASTING - Abnormal; Notable for the following components:       Result Value    SODIUM 135 (*)     ALBUMIN/GLOBULIN RATIO 1.5 (*)     OSMOLALITY, CALCULATED 268 (*)     All other components within normal limits    Narrative:     Estimated Glomerular Filtration Rate (eGFR) is calculated using the CKD-EPI (2021) equation, intended for patients 44 years of age and older. If gender is not documented or unknown, there will be no eGFR calculation.     PTT (PARTIAL THROMBOPLASTIN TIME) - Abnormal; Notable for the following components:    APTT 38.4 (*)     All other components within normal limits   CBC WITH DIFF - Abnormal; Notable for the following components:    LYMPHOCYTE % 9 (*)     MONOCYTE % 12 (*)     LYMPHOCYTE # 0.70 (*)     All other components within normal limits   PT/INR - Normal    Narrative:  In the setting of warfarin therapy, a moderate-intensity INR goal range is 2.0 to 3.0 and a high-intensity INR goal range is 2.5 to 3.5.    INR is ONLY validated to determine the level of anticoagulation with vitamin K antagonists (warfarin). Other factors may elevate the INR including but not limited to direct oral anticoagulants (DOACs), liver dysfunction, vitamin K deficiency, DIC, factor deficiencies, and factor inhibitors.   CBC/DIFF    Narrative:     The following orders were created for panel order CBC/DIFF.  Procedure                               Abnormality         Status                     ---------                               -----------         ------                     CBC WITH IPQQ[[id number]]                Abnormal            Final result                 Please view results for these tests on the individual orders.   URINALYSIS, MACROSCOPIC AND MICROSCOPIC W/CULTURE REFLEX    Narrative:     The following orders were created for panel order URINALYSIS, MACROSCOPIC AND MICROSCOPIC W/CULTURE REFLEX [PRN ONLY].  Procedure                                Abnormality         Status                     ---------                               -----------         ------                     URINALYSIS, MACROSCOPIC[[id number]]                                                     URINALYSIS, MICROSCOPIC[[id number]]                                                       Please view results for these tests on the individual orders.   URINALYSIS, MACROSCOPIC   URINALYSIS, MICROSCOPIC     XR KUB AND UPRIGHT ABDOMEN   Final Result by Edi, Radresults In (08/12 1735)   Multiple radiopaque foreign bodies in the right lower quadrant has the appearance consistent with staples.      Otherwise no acute disease  Radiologist location ID: [id number]           Medical Decision Making        Medical Decision Making  Patient's blood work unremarkable plain films of the abdomen demonstrates surgical staples however no obvious ingested foreign body.  Patient has no abdominal pain this likely will not cause any issues.  In regards to her nose.  She may have a cellulitis.  We will start her on clindamycin  secondary to her allergy she is also given a Bactroban  prescription to apply we discussed warm compresses primary care follow-up and return precautions                   Clinical Impression   Cellulitis of nose (Primary)       Disposition: Discharged

## 2024-07-11 NOTE — ED APP Handoff Note (Signed)
 [facility] - Emergency Department    Kaitlin Adams is a 63 y.o. female presenting to the ED today for:  Vomiting and Foreign Body Swallowed.  Patient reports that she swallowed 2 staples when taking her medications this morning.  She reports that she forgot that she had put the staples and an open can.  Patient reports general malaise, redness and swelling to the tip of her nose as well.  Patient reports that she feels bloated.    Additional History:   Past Medical History:   Diagnosis Date    Depression     Migraine     Multiple sclerosis (CMS HCC)     Seizure (CMS HCC)          Past Surgical History:   Procedure Laterality Date    HX APPENDECTOMY      HX CESAREAN SECTION      HX TONSILLECTOMY             Pertinent Exam findings:  Filed Vitals:    07/11/24 1647   BP: 132/79   Pulse: 91   Resp: 16   Temp: 36.3 C (97.3 F)   SpO2: 96%       Gen: Nontoxic in appearance.  Head: NC/AT  Neck: Trachea midline. No gross meningismus.  Psych: Denis SI/HI.    Assessment: Medical Screening Exam.    Plan/MDM at Triage:  I have considered labs, imaging, EKG.  These have been ordered as clinically indicated.  I have considered that the patient may need medications.  However, due to treatment space limitation, medications considered have to be appropriate for the waiting room.    I have considered the patient's chronic conditions and have counseled the patient that we will be evaluating for the presence of an emergency medical condition today in which their chronic conditions may or may not play a role.  I have advised that the patient should notify their PCP of today's visit regardless of disposition.  I have considered the patient's social determinants of health and health care literacy while determining the patient's initial plan of care.  I have further advised that the patient will possibly see a 2nd emergency provider today during their stay to help promote throughput.    I have performed an initial  medical screening evaluation in order to determine if the patient has an emergency medical condition. The patient has been evaluated.  Imminent life, limb, or sight threatening emergency will be taken to the treatment area immediately. The patient's orders will be reviewed by nursing staff and placed in the treatment area as soon as possible as bed capacity allows.  Should the patient's status change, or a new sign/symptom develop, immediate notification of nursing or ED staff has been advised.

## 2024-07-20 ENCOUNTER — Other Ambulatory Visit: Payer: Self-pay

## 2024-08-01 ENCOUNTER — Other Ambulatory Visit: Payer: Self-pay

## 2024-08-02 ENCOUNTER — Other Ambulatory Visit: Payer: Self-pay

## 2024-08-03 ENCOUNTER — Other Ambulatory Visit: Payer: Self-pay

## 2024-08-04 ENCOUNTER — Other Ambulatory Visit: Payer: Self-pay

## 2024-08-06 ENCOUNTER — Other Ambulatory Visit (INDEPENDENT_AMBULATORY_CARE_PROVIDER_SITE_OTHER): Payer: Self-pay | Admitting: Family

## 2024-08-07 ENCOUNTER — Other Ambulatory Visit: Payer: Self-pay

## 2024-08-07 MED ORDER — METHOCARBAMOL 500 MG TABLET
500.0000 mg | ORAL_TABLET | Freq: Every evening | ORAL | 5 refills | Status: AC
Start: 2024-08-07 — End: ?

## 2024-08-08 ENCOUNTER — Other Ambulatory Visit: Payer: Self-pay

## 2024-08-09 ENCOUNTER — Other Ambulatory Visit: Payer: Self-pay

## 2024-08-09 ENCOUNTER — Ambulatory Visit (HOSPITAL_COMMUNITY): Payer: Self-pay

## 2024-08-10 ENCOUNTER — Telehealth (INDEPENDENT_AMBULATORY_CARE_PROVIDER_SITE_OTHER): Payer: Self-pay

## 2024-08-10 ENCOUNTER — Other Ambulatory Visit: Payer: Self-pay

## 2024-08-10 NOTE — Telephone Encounter (Signed)
 Spoke with Kaitlin Adams regarding Mayzent  insurance coverage issues.   [facility] notified insurance is no longer active and patient is having difficulty speaking with anyone to sort out.     Kaitlin Adams reports she has been on [facility] until receiving notice on Wednesday she is no longer eligible because of income. Husband passed away recently causing a decrease in income. Previously was receiving over $3,000 per month with his disability, now only receiving $695 per month. Has tried calling [facility] several times but never receives a call back. Per letter, denied because $3,000 income is above the ~$2,500 per month limit. Confused because only receives $695 from disability. Very upset/ stressed by loss of husband. He was very sick and in a nursing home. Without his social security check, no longer able to afford rent on home. Has to move out by 9/15 which has been very difficult. Has to downsize to a town home, sons have been helping as much as possible but does not think will be able to get to be out of home by the 15th.     Confirmed she has been calling the correct number for [facility]. Case #[id number].  Denies being enrolled in Medicare. Has been on disability for the last 4 years due to MS, seizures, and anxiety     Discussed likely using income from prior to husband passing. Also possible they are requiring enrollment in medicare (usually required if on disability for an extended time).  [facility] will need to update this in their system/ provide clarification.  Recommended going to local office. Reports has not been able to get there or do much of anything. Fears going out in public with weakened immune system. Advised that although there is an increased risk for infection, immune system is not suppressed to the point avoiding the public is necessary. If worried can consider wearing a mask and frequently washing hands. Reports also has not felt like driving. Got new glasses that has not fully adjusted. 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     Confirmed no Mayzent  left on hand. Last dose was 2 days ago. Discussed risks of relapse/ rebound with S1P discontinuation and requirement to restart with titration pack if/when Mayzent  is resumed.   Highly recommend visiting [facility] this week to get insurance resolved as this will be the most straightforward, quick solution. Alternative options include applying for NPAF (requires application + first 2 pages of tax return) vs switching medications to one with an easier application process such as Ocrevus. Does not know where tax return is or how to locate.     Willing to switch medications to anything that will fix symptoms. Experiencing MS hug, cramps, and spasms. Takes methocarbamol  every night before going to bed. Hit or miss is helps. Denies any new/ recent symptoms or changes except increase in anxiety and depression since loss of husband. Gets dizzy when stands up. Thinks it is due to lack of sleep and nerves. Goes to bed 9-10:30 pm, awake by 3:30 am. Spoke w/ Geni regarding depression during last visit and sees psych every month, no adjustments to meds in many years.   Takes lorazepam , paroxetine , Ambien , and hydroxyzine . On Keppra  for seizures. Psych started paroxetine  because of sleep walking?     Highly encouraged she go to local [facility] office in person this week while out for mom's dialysis appointment. Explained that sorting out [place] medicaid will be most simple solution, especially given risk for rebound with discontinuation and lack of tax  return for NPAF. Kaitlin Adams stated she will try to go tomorrow.     Will call next week to check in/ start PAP process if not already in works with [facility], PHARMD  08/10/2024, 12:59

## 2024-08-11 ENCOUNTER — Other Ambulatory Visit: Payer: Self-pay

## 2024-08-14 ENCOUNTER — Other Ambulatory Visit: Payer: Self-pay

## 2024-08-15 ENCOUNTER — Other Ambulatory Visit: Payer: Self-pay

## 2024-08-16 ENCOUNTER — Other Ambulatory Visit: Payer: Self-pay

## 2024-08-17 ENCOUNTER — Other Ambulatory Visit: Payer: Self-pay

## 2024-08-17 ENCOUNTER — Encounter (INDEPENDENT_AMBULATORY_CARE_PROVIDER_SITE_OTHER): Payer: Self-pay

## 2024-08-17 ENCOUNTER — Ambulatory Visit

## 2024-08-17 DIAGNOSIS — G35 Multiple sclerosis: Secondary | ICD-10-CM | POA: Insufficient documentation

## 2024-08-17 NOTE — Progress Notes (Signed)
 NEUROLOGY, PHYSICIAN OFFICE CENTER  [address]  [place] [id number]  Operated by [facility]  Telephone Visit    Name:  Kaitlin Adams  MRN: [id number]    Date:  08/17/2024  Age:   63 y.o.          The patient/family initiated a request for telephone service.  Verbal consent for this service was obtained from the patient/family.    Patient verbalized consent to working with the clinical pharmacist(s) for the referred disease state(s) in accordance with the approved Collaborative Practice Agreement between the pharmacists and supervising physicians with [facility]. The patient understands all changes in their clinical condition and/or medication regimen will be reported to the physician. Patient acknowledges that they may decline/withdrawal consent to this agreement at any time.      History of Present Illness:  History obtained from:  patient  ------------------------    Alexandr Oehler is a 63 y.o. female referred by Andrea Sink, MD for Medication Problem      Talked to [facility] in [place] on Monday or Tuesday. The individual with [facility] did not think what was going on sounded right.  They are supposed to be sending paperwork in mail to help fix. Not received as of yesterday but has not had a chance to check PO box today. Plans to call office in [place] again next week if not received.     Not doing great today, under a lot of stress. Is 97 year old mother's primary caregiver, transports to dialysis. Mom is currently inpatient at [facility] with aneurysm. Also moved out of house into town home over the weekend. Sons have been helping but move has been chaotic. Nothing is where it is supposed to be, boxes are still packed and not organized. Can not find anything, has been trying to locate check book without success. There are moving boxes everywhere. Has tripped and fell at least 5 times due to boxes being in the way. Sore all over and knees down are bruised from falling/ walking into boxes.     [place]lemens  down steps and knocked self out on Tuesday night. Headache that makes need to cover head and avoid movement/ noise since hit head. Denies any changes in vision, speech, or cognition. Also denies bumps/ bruises or pressure in head. Avoiding driving. Has not been for evaluation due to not having insurance. Does not feel needs to be seen/ go to the emergency room to be checked out. Slept/ woke up without issue the next day and otherwise feels fine.      Since running out of Mayzent  feels okay. Denies any new/ worsening symptoms besides headache from fall. Has wonderful days where can get up, shower, take mom to dialysis, etc. Others not so much. Last night did have spasm in foot despite taking methocarbamol  before bed. Had to get up and jump on the bed to make go away. Appetite has not been great but believes 2/2 stress from move. Otherwise feels okay, just waiting for son to come move washing machine today.     Has not had to refill medications since lost insurance besides Ambien . Only had to pay $8 for refill yesterday.     Attempted to review allergies. Does not recall specific reaction or medication that caused the reactions. Uncle was a doctor and always tested before taking a medication. Told mom, sister, and herself should not take sulfa drugs due to swelling. He gave a vitamin K injection for anemia which also  caused swelling.     Disease categorization: SPMS  Date diagnosed: 2015    Current DMT: None (Mayzent  until insurance issues)  Previous DMTs:   - [facility] (2015- 2022): insurance coverage issues.    - Mayzent  (09/2021- 03/2023, 05/2024-08/08/2024): delayed follow up, loss of insurance      Past Medical History:   Diagnosis Date    Anxiety     Depression     Migraine     Multiple sclerosis (CMS HCC)     Seizure (CMS HCC)          Current Outpatient Medications   Medication Sig    Cyanocobalamin 1,000 mcg Sublingual Tablet, Sublingual Place 1 Tablet (1,000 mcg total) under the tongue Daily    ergocalciferol ,  vitamin D2, (DRISDOL) 1,250 mcg (50,000 unit) Oral Capsule Take 1 Capsule (50,000 Units total) by mouth 2x/week    hydrOXYzine  pamoate (VISTARIL ) 50 mg Oral Capsule TAKE 1 CAPSULE BY MOUTH THREE TIMES DAILY AS NEEDED (Patient taking differently: Take 1 Capsule (50 mg total) by mouth Every night)    levETIRAcetam  (KEPPRA ) 500 mg Oral Tablet Take 2 Tablets (1,000 mg total) by mouth Twice daily    LORazepam  (ATIVAN ) 1 mg Oral Tablet Take 1 Tablet (1 mg total) by mouth Three times a day as needed for Anxiety    methocarbamoL  (ROBAXIN ) 500 mg Oral Tablet Take 1 Tablet (500 mg total) by mouth Every night    PARoxetine  (PAXIL ) 20 mg Oral Tablet Take 1 Tablet (20 mg total) by mouth Once a day    siponimod  (MAYZENT ) 2 mg Oral Tablet Take 1 Tablet (2 mg total) by mouth Daily (Patient not taking: Reported on 08/17/2024)    zolpidem  (AMBIEN ) 10 mg Oral Tablet Take 1 Tablet (10 mg total) by mouth Every night as needed (Patient taking differently: Take 1 Tablet (10 mg total) by mouth Every evening)     Immunization History   Administered Date(s) Administered    Covid-19 Vaccine,Pfizer-BioNTech,Purple Top,34yrs+ 02/08/2020, 02/29/2020, 09/05/2020     Allergies[1]    Family Medical History:       Problem Relation (Age of Onset)    Aneurysm Mother    Dialysis Mother    Kidney failure Mother    Multiple Sclerosis Other    No Known Problems Father, Sister, Brother, Maternal Aunt, Maternal Uncle, Paternal Aunt, Paternal Uncle, Maternal Grandmother, Maternal Grandfather, Paternal Grandmother, Paternal Grandfather, Daughter, Son           Social History     Socioeconomic History    Marital status: Widowed    Number of children: 2   Tobacco Use    Smoking status: Some Days     Types: Cigarettes     Passive exposure: Never    Smokeless tobacco: Never   Vaping Use    Vaping status: Never Used   Substance and Sexual Activity    Alcohol use: Yes     Comment: social    Drug use: Never    Sexual activity: Not Currently         Assessment and  Plan    Assessment/Plan   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        Highly encouraged to be evaluated for head injury, especially given persistent headache. Discussed with Dr. Neomi - agree patient should be evaluated.       For DMT plan, again reviewed options:  1. Waiting for [facility] Medicaid to be sorted out: risky given uncertain how long this will take, possible to have relapse/ disease progression while waiting.   2. Applying for patient assistance program: requires completion of application + submission of proof of income. Possible they will accept letter from [facility] in lieu of tax return   3.  Switch DMT to a medication with PAP that does not require proof of income (Ocrevus): this would require updated blood work which could be expensive without insurance.     Prefers to hold off and wait on [employer]/ [facility] Medicaid situation to be sorted. Encouraged to contact office with any new/ worsening symptoms.        Medication Reconciliation:   Takes Ambien  QHS  Only takes hydroxyzine  at bedtime.   Lorazepam  QHS to help sleep. Occasionally takes second or third dose on days when it's needed.   Stopped taking Abilify . Told prescriber she was unhappy with how it makes feel. Unsure how to explain besides does not feel like self. Makes sleepy  Stopped Remeron . Told doctor didn't want to continue because needed to be able to be alert to care for mom and husband if needed.  Was taking Linzess as needed but has stopped using and is trying to eat better (broccoli, fruit, green tea) instead     Taking several medications that can cause drowsiness, which may also be contributing to frequent falls. Should discuss Abilify  with prescriber as may not be safe to stop abruptly and possible other medications are contributing to fatigue as well.     Requested update if hears back from [facility]. Will plan to check back in after 2-3 weeks if no update before  then.    Total provider time spent with the patient on the phone: 40 minutes.  Nevelyn Roof, PHARMD           [1]   Allergies  Allergen Reactions    Latex     Adhesive  Other Adverse Reaction (Add comment)     Rips skin/ band aids     Rips skin/ band aids    Ceclor [Cefaclor]     Neomycin-Polymyxin-Hc  Other Adverse Reaction (Add comment)     Other    Penicillins     Sulfa (Sulfonamides)

## 2024-08-21 ENCOUNTER — Other Ambulatory Visit: Payer: Self-pay

## 2024-08-23 ENCOUNTER — Other Ambulatory Visit: Payer: Self-pay

## 2024-08-24 ENCOUNTER — Other Ambulatory Visit: Payer: Self-pay

## 2024-08-25 ENCOUNTER — Other Ambulatory Visit: Payer: Self-pay

## 2024-08-28 ENCOUNTER — Other Ambulatory Visit: Payer: Self-pay

## 2024-09-01 ENCOUNTER — Other Ambulatory Visit: Payer: Self-pay

## 2024-09-04 ENCOUNTER — Other Ambulatory Visit: Payer: Self-pay

## 2024-09-05 ENCOUNTER — Other Ambulatory Visit: Payer: Self-pay

## 2024-09-07 ENCOUNTER — Other Ambulatory Visit: Payer: Self-pay

## 2024-09-10 ENCOUNTER — Encounter (INDEPENDENT_AMBULATORY_CARE_PROVIDER_SITE_OTHER): Payer: Self-pay | Admitting: Neurology

## 2024-09-11 ENCOUNTER — Encounter (INDEPENDENT_AMBULATORY_CARE_PROVIDER_SITE_OTHER): Payer: Self-pay | Admitting: Neurology

## 2024-09-11 ENCOUNTER — Other Ambulatory Visit: Payer: Self-pay

## 2024-11-28 ENCOUNTER — Encounter (INDEPENDENT_AMBULATORY_CARE_PROVIDER_SITE_OTHER): Payer: Self-pay | Admitting: Neurology

## 2024-11-30 ENCOUNTER — Encounter (HOSPITAL_COMMUNITY): Payer: Self-pay

## 2024-11-30 ENCOUNTER — Other Ambulatory Visit: Payer: Self-pay

## 2024-11-30 ENCOUNTER — Emergency Department
Admission: EM | Admit: 2024-11-30 | Discharge: 2024-11-30 | Disposition: A | Discharge status: Home / Self Care | Attending: Physician Assistant | Admitting: Physician Assistant

## 2024-11-30 DIAGNOSIS — L0201 Cutaneous abscess of face: Secondary | ICD-10-CM | POA: Insufficient documentation

## 2024-11-30 DIAGNOSIS — Z881 Allergy status to other antibiotic agents status: Secondary | ICD-10-CM | POA: Insufficient documentation

## 2024-11-30 DIAGNOSIS — Z8614 Personal history of Methicillin resistant Staphylococcus aureus infection: Secondary | ICD-10-CM | POA: Insufficient documentation

## 2024-11-30 DIAGNOSIS — Z882 Allergy status to sulfonamides status: Secondary | ICD-10-CM | POA: Insufficient documentation

## 2024-11-30 LAB — COMPREHENSIVE METABOLIC PANEL, NON-FASTING
ALBUMIN/GLOBULIN RATIO: 1.5 — ABNORMAL HIGH (ref 0.8–1.4)
ALBUMIN: 4.3 g/dL (ref 3.5–5.7)
ALKALINE PHOSPHATASE: 97 U/L (ref 34–104)
ALT (SGPT): 15 U/L (ref 7–52)
ANION GAP: 13 mmol/L (ref 4–13)
AST (SGOT): 21 U/L (ref 13–39)
BILIRUBIN TOTAL: 0.4 mg/dL (ref 0.3–1.0)
BUN/CREA RATIO: 9 (ref 6–22)
BUN: 7 mg/dL (ref 7–25)
CALCIUM, CORRECTED: 8.8 mg/dL — ABNORMAL LOW (ref 8.9–10.8)
CALCIUM: 9 mg/dL (ref 8.6–10.3)
CHLORIDE: 99 mmol/L (ref 98–107)
CO2 TOTAL: 20 mmol/L — ABNORMAL LOW (ref 21–31)
CREATININE: 0.77 mg/dL (ref 0.60–1.30)
ESTIMATED GFR: 86 mL/min/1.73mˆ2 (ref >59)
GLOBULIN: 2.8 (ref 2.0–3.5)
GLUCOSE: 69 mg/dL — ABNORMAL LOW (ref 74–109)
OSMOLALITY, CALCULATED: 261 mosm/kg — ABNORMAL LOW (ref 270–290)
POTASSIUM: 4 mmol/L (ref 3.5–5.1)
PROTEIN TOTAL: 7.1 g/dL (ref 6.4–8.9)
SODIUM: 132 mmol/L — ABNORMAL LOW (ref 136–145)

## 2024-11-30 LAB — CBC WITH DIFF
BASOPHIL #: 0 x10ˆ3/uL (ref 0.00–0.10)
BASOPHIL %: 0 % (ref 0–1)
EOSINOPHIL #: 0.1 x10ˆ3/uL (ref 0.00–0.50)
EOSINOPHIL %: 1 % (ref 1–7)
HCT: 39.2 % (ref 31.2–41.9)
HGB: 13.5 g/dL (ref 10.9–14.3)
LYMPHOCYTE #: 0.7 x10ˆ3/uL — ABNORMAL LOW (ref 1.10–3.10)
LYMPHOCYTE %: 6 % — ABNORMAL LOW (ref 16–46)
MCH: 30.7 pg (ref 24.7–32.8)
MCHC: 34.4 g/dL (ref 32.3–35.6)
MCV: 89.2 fL (ref 75.5–95.3)
MONOCYTE #: 0.5 x10ˆ3/uL (ref 0.20–0.90)
MONOCYTE %: 4 % (ref 4–11)
MPV: 7.9 fL (ref 7.9–10.8)
NEUTROPHIL #: 11 x10ˆ3/uL — ABNORMAL HIGH (ref 1.90–8.20)
NEUTROPHIL %: 89 % — ABNORMAL HIGH (ref 43–77)
PLATELETS: 311 x10ˆ3/uL (ref 140–440)
RBC: 4.4 x10ˆ6/uL (ref 3.63–4.92)
RDW: 12.5 % (ref 12.3–17.7)
WBC: 12.3 x10ˆ3/uL — ABNORMAL HIGH (ref 3.8–11.8)

## 2024-11-30 LAB — LACTIC ACID LEVEL W/ REFLEX FOR LEVEL >2.0: LACTIC ACID: 2.4 mmol/L — ABNORMAL HIGH (ref 0.5–2.2)

## 2024-11-30 MED ORDER — LIDOCAINE HCL 10 MG/ML (1 %) INJECTION SOLUTION: 15.0000 mL | INTRAMUSCULAR | Status: DC

## 2024-11-30 MED ORDER — DOXYCYCLINE HYCLATE 100 MG CAPSULE: 100.0000 mg | ORAL_CAPSULE | Freq: Two times a day (BID) | ORAL | 0 refills | Status: AC

## 2024-11-30 MED ORDER — LIDOCAINE HCL 10 MG/ML (1 %) INJECTION SOLUTION
INTRAMUSCULAR | Status: AC
  Filled 2024-11-30: qty 20

## 2024-11-30 NOTE — ED Nurses Note (Signed)
 Patient D/C home at this time. Instructions reviewed and understanding verbalized. Discharge paperwork provided. Patient left department via ambulation.

## 2024-11-30 NOTE — ED Nurses Note (Signed)
 Patient to ED room 31 via wheelchair. She says she had her eyebrows waxed about 4 days ago and her left eyebrow now has a raised red area that she believes is infected. She says she squeezed it this morning and some green liquid came out of it.

## 2024-11-30 NOTE — ED Triage Notes (Signed)
 Had waxing done to eyebrows states has been messing with them and the left side is infected

## 2024-11-30 NOTE — Discharge Instructions (Addendum)
 VISIT SUMMARY:  You came in today due to swelling and infection of your left eyebrow following a waxing procedure. You have been experiencing tenderness and subjective fever, and the area has not improved despite your attempts to manage it at home.    YOUR PLAN:  CUTANEOUS ABSCESS OF LEFT EYEBROW: You have an infected and swollen area on your left eyebrow, which has become tender and caused you to feel feverish. This likely started after your recent eyebrow waxing.  -We performed a procedure to drain the abscess after numbing the area with local anesthesia.  -We ordered blood tests to check for any signs of a more widespread infection.   -  Begin taking doxycycline  and apply warm moist compresses to the affected area.  - Once we received the results of your wound culture, we will contact you if your antibiotics need to be changed.  - Return to the ED for increased swelling, spreading redness, or for any other new or concerning symptoms.      Contains text generated by Abridge

## 2024-11-30 NOTE — ED Provider Notes (Signed)
 [facility] - Emergency Department  ED Primary Note  History of Present Illness  Kaitlin Adams is a 64 year old female who presents with swelling and infection of the left eyebrow following a waxing procedure.    Approximately four days ago, after having her eyebrows waxed, she experienced irritation followed by swelling of the left eyebrow and cheek. The swelling has persisted and is accompanied by tenderness and infection.    She attempted to manage the swelling by popping it with 'Q tips', but the area remains swollen and tender. She has also experienced a fever, which she describes as feeling like she has one now. She has been using leftover clindamycin  for the past two days without improvement.    She mentions a previous similar incident earlier this year, where she had a swelling in the back that required numbing and drainage.    She is allergic to Bactrim and all antibiotics except erythromycin.  Physical Exam   ED Triage Vitals [11/30/24 1035]   BP (Non-Invasive) 136/80   Heart Rate 97   Respiratory Rate 18   Temperature 36.8 C (98.2 F)   SpO2 95 %   Weight 52.2 kg (115 lb)   Height 1.615 m (5' 3.6)     Physical Exam  GENERAL: Alert, cooperative, well developed, no acute distress.  HEENT: Normocephalic, moist mucous membranes. Erythematous fluctuant abscess of the left eyebrow with surrounding erythema and tenderness to palpation. Eyes normal.  CHEST: Clear to auscultation bilaterally. No wheezes, rhonchi, or crackles.  CARDIOVASCULAR: Normal heart rate and rhythm, S1 and S2 normal without murmurs.  EXTREMITIES: No cyanosis or edema.  NEUROLOGICAL: Cranial nerves grossly intact. Moves all extremities without gross motor or sensory deficit.  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 No packing was placed due to the small opening in the skin and risk of scarring.    Labs Ordered/Reviewed   COMPREHENSIVE METABOLIC PANEL, NON-FASTING - Abnormal; Notable for the following components:       Result Value    SODIUM 132 (*)     CO2 TOTAL 20 (*)     GLUCOSE 69 (*)     ALBUMIN/GLOBULIN RATIO 1.5 (*)     OSMOLALITY, CALCULATED 261 (*)     CALCIUM, CORRECTED 8.8 (*)     All other components within normal limits    Narrative:     Estimated Glomerular Filtration Rate (eGFR) is calculated using the CKD-EPI (2021) equation, intended for patients 30 years of age and older. If gender is not documented or unknown, there will be no eGFR calculation.     LACTIC ACID LEVEL W/ REFLEX FOR LEVEL >2.0 - Abnormal; Notable for the following components:    LACTIC ACID 2.4 (*)     All other components within normal limits   CBC WITH DIFF - Abnormal; Notable for the following components:    WBC 12.3 (*)     NEUTROPHIL % 89 (*)     LYMPHOCYTE % 6 (*)     NEUTROPHIL # 11.00 (*)     LYMPHOCYTE # 0.70 (*)     All other components within normal limits   ADULT ROUTINE BLOOD CULTURE, SET OF 2 BOTTLES (BACTERIA AND YEAST)   ADULT ROUTINE BLOOD CULTURE, SET OF 2 BOTTLES (  BACTERIA AND YEAST)   WOUND, SUPERFICIAL/NON-STERILE SITE, AEROBIC CULTURE AND GRAM STAIN   WOUND, SUPERFICIAL/NON-STERILE SITE, AEROBIC CULTURE AND GRAM STAIN   CBC/DIFF    Narrative:     The following orders were created for panel order CBC/DIFF.  Procedure                               Abnormality         Status                     ---------                               -----------         ------                     CBC WITH IPQQ[[id number]]                Abnormal            Final result                 Please view results for these tests on the individual orders.   LACTIC ACID - FIRST REFLEX     No orders to display     Medical Decision Making          Medical Decision Making  64 year old female presented with a four-day history of left  eyebrow swelling and erythema following an eyebrow waxing procedure, associated with fever and tenderness. Examination revealed an erythematous, fluctuant abscess of the left eyebrow with surrounding erythema and tenderness to palpation. The patient reported a prior similar episode requiring incision and drainage. 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 She had previously attempted clindamycin  therapy without improvement.    Cutaneous abscess of left eyebrow  - Performed incision and drainage of the left eyebrow abscess without packing to minimize scarring.  - Obtained wound culture.  -  Prescribed doxycycline  until culture results are available to adjust antimicrobial therapy as needed.  - Recommended warm, moist compresses on the affected area.  Medical Decision Making  Amount and/or Complexity of Data Reviewed  Labs: ordered.    Risk  Prescription drug management.                Medications Ordered/Administered in the ED   lidocaine  1% injection (has no administration in time range)     Clinical Impression   Abscess of eyebrow (Primary)       Disposition: Discharged           This note was created with assistance from Abridge via capture of conversational audio.  Consent was obtained from the patient prior to recording.

## 2024-12-02 ENCOUNTER — Ambulatory Visit (HOSPITAL_COMMUNITY): Payer: Self-pay | Admitting: NURSE PRACTITIONER

## 2024-12-03 ENCOUNTER — Encounter (INDEPENDENT_AMBULATORY_CARE_PROVIDER_SITE_OTHER): Payer: Self-pay | Admitting: Neurology

## 2024-12-03 LAB — WOUND, SUPERFICIAL/NON-STERILE SITE, AEROBIC CULTURE AND GRAM STAIN: GRAM STAIN: NONE SEEN

## 2024-12-05 LAB — ANAEROBIC CULTURE

## 2024-12-05 LAB — ADULT ROUTINE BLOOD CULTURE, SET OF 2 BOTTLES (BACTERIA AND YEAST)
BLOOD CULTURE, ROUTINE: NO GROWTH
BLOOD CULTURE, ROUTINE: NO GROWTH

## 2024-12-25 ENCOUNTER — Encounter (INDEPENDENT_AMBULATORY_CARE_PROVIDER_SITE_OTHER): Payer: Self-pay | Admitting: Neurology

## 2024-12-25 MED ORDER — LEVETIRACETAM 500 MG TABLET: 1000.0000 mg | ORAL_TABLET | Freq: Two times a day (BID) | ORAL | 1 refills | Status: AC

## 2024-12-26 ENCOUNTER — Encounter (INDEPENDENT_AMBULATORY_CARE_PROVIDER_SITE_OTHER): Payer: Self-pay | Admitting: Neurology
